# Patient Record
Sex: Female | Born: 1965
Health system: Southern US, Community
[De-identification: ages and names within clinical notes are randomized; demographics above are authoritative.]

## PROBLEM LIST (undated history)

## (undated) ENCOUNTER — Ambulatory Visit: Source: Home / Self Care

## (undated) DIAGNOSIS — R011 Cardiac murmur, unspecified: Secondary | ICD-10-CM

## (undated) DIAGNOSIS — R42 Dizziness and giddiness: Secondary | ICD-10-CM

## (undated) DIAGNOSIS — T7840XA Allergy, unspecified, initial encounter: Secondary | ICD-10-CM

## (undated) DIAGNOSIS — Z780 Asymptomatic menopausal state: Secondary | ICD-10-CM

## (undated) DIAGNOSIS — M1990 Unspecified osteoarthritis, unspecified site: Secondary | ICD-10-CM

## (undated) HISTORY — DX: Cardiac murmur, unspecified: R01.1

## (undated) HISTORY — DX: Asymptomatic menopausal state: Z78.0

## (undated) HISTORY — DX: Allergy, unspecified, initial encounter: T78.40XA

---

## 1998-09-19 ENCOUNTER — Emergency Department (HOSPITAL_COMMUNITY): Admission: EM | Admit: 1998-09-19 | Discharge: 1998-09-19 | Payer: Self-pay | Admitting: Emergency Medicine

## 1999-05-30 HISTORY — PX: ABDOMINAL HYSTERECTOMY: SHX81

## 1999-06-15 ENCOUNTER — Inpatient Hospital Stay (HOSPITAL_COMMUNITY): Admission: RE | Admit: 1999-06-15 | Discharge: 1999-06-17 | Payer: Self-pay | Admitting: Obstetrics and Gynecology

## 2015-04-12 ENCOUNTER — Encounter: Payer: Self-pay | Admitting: Physician Assistant

## 2015-04-12 ENCOUNTER — Ambulatory Visit (INDEPENDENT_AMBULATORY_CARE_PROVIDER_SITE_OTHER): Payer: BLUE CROSS/BLUE SHIELD | Admitting: Physician Assistant

## 2015-04-12 VITALS — BP 127/76 | HR 62 | Temp 98.0°F | Resp 16 | Ht 63.5 in | Wt 207.0 lb

## 2015-04-12 DIAGNOSIS — Z131 Encounter for screening for diabetes mellitus: Secondary | ICD-10-CM

## 2015-04-12 DIAGNOSIS — G4700 Insomnia, unspecified: Secondary | ICD-10-CM

## 2015-04-12 DIAGNOSIS — Z23 Encounter for immunization: Secondary | ICD-10-CM | POA: Diagnosis not present

## 2015-04-12 DIAGNOSIS — Z1321 Encounter for screening for nutritional disorder: Secondary | ICD-10-CM

## 2015-04-12 DIAGNOSIS — Z13220 Encounter for screening for lipoid disorders: Secondary | ICD-10-CM

## 2015-04-12 NOTE — Progress Notes (Signed)
[facility] [address] [place] [address] [phone]  Date:  04/12/2015   Name:  Allison Wright   DOB:  1966/01/25   MRN:  [id number]  PCP:  No primary care provider on file.    Chief Complaint: Establish with practice   History of Present Illness:  This is a 49 y.o. female with PMH env allergies who is presenting to establish care. She last saw a PCP 1.5 years ago.  She sees her GYN regularly. She gets mammogram and paps there. Last mammogram 1 year ago and negative. GYN has also referred her for colonoscopy and but has not heard about it yet. Had partial hysterectomy 2001. Still has both ovaries. She feels she may be starting to enter menopause now. She will get occ hot flashes at night. These do not bother her significantly.  Takes otc sudafed maybe once a week or once every two weeks for allergies.  She has problems with insomnia. She wakes several times during the night. She thinks she wakes because she fears oversleeping. She has an alarm set across the room and one on her phone. She has never tried anything for sleep before. She thinks she gets about 4.5 hours sleep a night and then usually takes a 1-2 hour nap when she gets home from work.  Fam hx - father with DM2. PGM with CHF. No hx CVA, MI, cancers  Review of Systems:  Review of Systems See HPI  There are no active problems to display for this patient.   Prior to Admission medications   Not on File    No Known Allergies  History reviewed. No pertinent past surgical history.  Social History  Substance Use Topics  . Smoking status: Former Smoker    Quit date: 05/29/1996  . Smokeless tobacco: None  . Alcohol Use: No    History reviewed. No pertinent family history.  Medication list has been reviewed and updated.  Physical Examination:  Physical Exam  Constitutional: She is oriented to person, place, and time. She appears well-developed and well-nourished. No distress.   HENT:  Head: Normocephalic and atraumatic.  Right Ear: Hearing, tympanic membrane, external ear and ear canal normal.  Left Ear: Hearing, tympanic membrane, external ear and ear canal normal.  Nose: Nose normal.  Mouth/Throat: Uvula is midline, oropharynx is clear and moist and mucous membranes are normal.  Eyes: Conjunctivae and lids are normal. Right eye exhibits no discharge. Left eye exhibits no discharge. No scleral icterus.  Neck: Trachea normal. Carotid bruit is not present. No thyromegaly present.  Cardiovascular: Normal rate, regular rhythm, normal heart sounds and normal pulses.   No murmur heard. Pulmonary/Chest: Effort normal and breath sounds normal. No respiratory distress. She has no wheezes. She has no rhonchi. She has no rales.  Abdominal: Soft. Normal appearance and bowel sounds are normal. She exhibits no abdominal bruit. There is no tenderness.  Musculoskeletal: Normal range of motion.  Lymphadenopathy:       Head (right side): No submental, no submandibular and no tonsillar adenopathy present.       Head (left side): No submental, no submandibular and no tonsillar adenopathy present.    She has no cervical adenopathy.  Neurological: She is alert and oriented to person, place, and time.  Skin: Skin is warm, dry and intact. No lesion and no rash noted.  Psychiatric: She has a normal mood and affect. Her speech is normal and behavior is normal. Thought content normal.  BP 127/76 mmHg  Pulse 62  Temp(Src) 98 F (36.7 C) (Oral)  Resp 16  Ht 5' 3.5" (1.613 m)  Wt 207 lb (93.895 kg)  BMI 36.09 kg/m2  SpO2 98%  Assessment and Plan:  1. Insomnia Pt has never tried anything for insomnia before. Sounds like most of her waking is d/t worry about oversleeping. She is going to try melatonin to see if this helps. We discussed that there is no way she will oversleep with 2 alarms set. She knows she shouldn't worry. - CBC; Future - TSH; Future  2. Need for Tdap  vaccination - Tdap vaccine greater than or equal to 7yo IM  3. Lipid screening - Lipid panel; Future  4. Diabetes mellitus screening - Comprehensive metabolic panel; Future  5. Encounter for vitamin deficiency screening - Vitamin D 1,25 dihydroxy; Future  GYN is referring her for mammogram and colonoscopy.  Return in 1 year for follow up or sooner if needed.   Roswell MinersNicole V. Dyke BrackettBush, PA-C, MHS [facility]  04/13/2015

## 2015-04-12 NOTE — Patient Instructions (Signed)
Return for fasting blood work. Get your mammogram and colonoscopy this year. Try melatonin for sleep. You may take up to 10 mg before bed. You are doing great with diet. Increase your exercise and work on weight loss but you are doing great!!! Return in 1 year for complete physical or sooner if needed.

## 2015-10-13 DIAGNOSIS — Z1272 Encounter for screening for malignant neoplasm of vagina: Secondary | ICD-10-CM | POA: Diagnosis not present

## 2015-10-13 DIAGNOSIS — Z01419 Encounter for gynecological examination (general) (routine) without abnormal findings: Secondary | ICD-10-CM | POA: Diagnosis not present

## 2015-10-13 DIAGNOSIS — Z6835 Body mass index (BMI) 35.0-35.9, adult: Secondary | ICD-10-CM | POA: Diagnosis not present

## 2015-10-13 DIAGNOSIS — Z1231 Encounter for screening mammogram for malignant neoplasm of breast: Secondary | ICD-10-CM | POA: Diagnosis not present

## 2015-10-13 DIAGNOSIS — Z124 Encounter for screening for malignant neoplasm of cervix: Secondary | ICD-10-CM | POA: Diagnosis not present

## 2015-10-19 ENCOUNTER — Other Ambulatory Visit: Payer: Self-pay | Admitting: Obstetrics and Gynecology

## 2015-10-19 DIAGNOSIS — R928 Other abnormal and inconclusive findings on diagnostic imaging of breast: Secondary | ICD-10-CM

## 2015-10-26 ENCOUNTER — Ambulatory Visit
Admission: RE | Admit: 2015-10-26 | Discharge: 2015-10-26 | Disposition: A | Discharge status: Home / Self Care | Payer: Federal, State, Local not specified - PPO | Source: Ambulatory Visit | Attending: Obstetrics and Gynecology | Admitting: Obstetrics and Gynecology

## 2015-10-26 DIAGNOSIS — R928 Other abnormal and inconclusive findings on diagnostic imaging of breast: Secondary | ICD-10-CM

## 2015-10-26 DIAGNOSIS — N63 Unspecified lump in breast: Secondary | ICD-10-CM | POA: Diagnosis not present

## 2015-10-26 DIAGNOSIS — N6012 Diffuse cystic mastopathy of left breast: Secondary | ICD-10-CM | POA: Diagnosis not present

## 2015-11-22 DIAGNOSIS — K5900 Constipation, unspecified: Secondary | ICD-10-CM | POA: Diagnosis not present

## 2015-12-20 DIAGNOSIS — Z1211 Encounter for screening for malignant neoplasm of colon: Secondary | ICD-10-CM | POA: Diagnosis not present

## 2015-12-20 DIAGNOSIS — D122 Benign neoplasm of ascending colon: Secondary | ICD-10-CM | POA: Diagnosis not present

## 2015-12-20 DIAGNOSIS — D126 Benign neoplasm of colon, unspecified: Secondary | ICD-10-CM | POA: Diagnosis not present

## 2015-12-20 DIAGNOSIS — D125 Benign neoplasm of sigmoid colon: Secondary | ICD-10-CM | POA: Diagnosis not present

## 2015-12-20 DIAGNOSIS — K644 Residual hemorrhoidal skin tags: Secondary | ICD-10-CM | POA: Diagnosis not present

## 2015-12-20 DIAGNOSIS — K648 Other hemorrhoids: Secondary | ICD-10-CM | POA: Diagnosis not present

## 2016-03-21 ENCOUNTER — Other Ambulatory Visit: Payer: Self-pay | Admitting: Obstetrics and Gynecology

## 2016-03-21 DIAGNOSIS — N6320 Unspecified lump in the left breast, unspecified quadrant: Secondary | ICD-10-CM

## 2016-04-28 ENCOUNTER — Ambulatory Visit
Admission: RE | Admit: 2016-04-28 | Discharge: 2016-04-28 | Disposition: A | Discharge status: Home / Self Care | Payer: Federal, State, Local not specified - PPO | Source: Ambulatory Visit | Attending: Obstetrics and Gynecology | Admitting: Obstetrics and Gynecology

## 2016-04-28 ENCOUNTER — Other Ambulatory Visit: Payer: Self-pay | Admitting: Obstetrics and Gynecology

## 2016-04-28 DIAGNOSIS — N6320 Unspecified lump in the left breast, unspecified quadrant: Secondary | ICD-10-CM

## 2016-10-05 ENCOUNTER — Ambulatory Visit (INDEPENDENT_AMBULATORY_CARE_PROVIDER_SITE_OTHER): Payer: Federal, State, Local not specified - PPO

## 2016-10-05 ENCOUNTER — Encounter: Payer: Self-pay | Admitting: Family Medicine

## 2016-10-05 ENCOUNTER — Ambulatory Visit (INDEPENDENT_AMBULATORY_CARE_PROVIDER_SITE_OTHER): Payer: Federal, State, Local not specified - PPO | Admitting: Family Medicine

## 2016-10-05 VITALS — BP 125/80 | HR 56 | Temp 98.3°F | Resp 16 | Ht 63.0 in | Wt 216.3 lb

## 2016-10-05 DIAGNOSIS — M1711 Unilateral primary osteoarthritis, right knee: Secondary | ICD-10-CM

## 2016-10-05 DIAGNOSIS — M25561 Pain in right knee: Secondary | ICD-10-CM | POA: Diagnosis not present

## 2016-10-05 MED ORDER — IBUPROFEN 800 MG PO TABS: 800.0000 mg | ORAL_TABLET | Freq: Three times a day (TID) | ORAL | 1 refills | Status: DC | PRN

## 2016-10-05 NOTE — Patient Instructions (Addendum)
Your xray did show wear and tear arthritis of your knee.  The best initial treatment for this is pain and anti-inflammation medicine such as Ibuprofen.  I have sent this in for you.   I am also referring you to physical therapy.  Give this a shot for the next 2 - 4 weeks.  If you're still having trouble after that, we can talk about a referral to sports medicine or orthopedics for other recommendations.   It was good to meet you today  We recommend that you schedule a mammogram for breast cancer screening. Typically, you do not need a referral to do this. Please contact a local imaging center to schedule your mammogram.  [facility] - [phone]) [phone]  *ask for the Radiology Department [facility] - [phone]) [phone] or [phone]  [facility] - [phone] [facility] - [phone]) [phone] [facility] - [phone]  *ask for the Radiology Department [facility] - [phone]) [phone]  *ask for the Radiology Department [facility] - [phone]  *ask for the Mammography Department [facility] - [phone](336) [phone]    IF you received an x-ray today, you will receive an invoice from [facility]. Please contact [facility] at [phone] with questions or concerns regarding your invoice.   IF you received labwork today, you will receive an invoice from [facility]. Please contact [facility] at [phone] with questions or concerns regarding your invoice.   Our billing staff will not be able to assist you with questions regarding bills from these companies.  You will be contacted with the lab results as soon as they are available. The fastest way to get your results is to activate your My Chart account. Instructions are located on the last page of this paperwork. If you have not heard from us regarding the results in 2 weeks, please contact this office.

## 2016-10-05 NOTE — Progress Notes (Signed)
   Allison Wright is a 51 y.o. female who [facility] today for Right knee pain:  1.  Right knee pain:  Patient with Right knee pain for past 2-3 days.  Describes pain medial aspect of Right knee.  Worse when moving from sitting to standing.  Feels that it "gets stiff" when she sits for period of time.  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    No falls/injuries.  History of similar knee pain several years ago.  States she went through work-up but never received diagnosis and then she switched doctors.  Has had intermittent episodes of mild knee swelling since then but no actual pain.    She's tried Ben-gay and OTC ibuprofen with some relief.    ROS as above.    Fam Hx:  Father with gout and osteoarthritis.  No other joint issues that run in family.  Patient denies any other relevant family history.    PMH reviewed. Patient is a nonsmoker.   Past Medical History:  Diagnosis Date  . Allergy   . Heart murmur    Past Surgical History:  Procedure Laterality Date  . ABDOMINAL HYSTERECTOMY  2001    Medications reviewed. No current outpatient prescriptions on file.   No current facility-administered medications for this visit.      Physical Exam:  BP 125/80 (BP Location: Right Arm, Patient Position: Sitting, Cuff Size: Large)   Pulse (!) 56   Temp 98.3 F (36.8 C) (Oral)   Resp 16   Ht 5\' 3"  (1.6 m)   Wt 216 lb 4.8 oz (98.1 kg)   SpO2 100%   BMI 38.32 kg/m  Gen:  Alert, cooperative patient who appears stated age in no acute distress.  Vital signs reviewed. HEENT: EOMI,  MMM MSK:  Left knee WNL - Right knee:  Some minimal effusion noted over the patella..  No other effusion noted.  No redness, no warmth to joint.  Has tenderness to palpation along medial and lateral joint lines as well as medial aspect of patella. Ant/post drawer test negative.  Lachmann's negative, McMurray's  negative.  Patella-femoral testing also negative.    Assessment and Plan:  1.  Right knee pain: - Osteoarthritis found on radiographs.  Tricompartmental arthritis.   - discussed treatment options with patient.   - Plan will be for now to do conservative therapy with physical therapy and Ibuprofen as antiinflammatory.   - Declined joint injection - she would like to try this for next 2-4 weeks and the FU at that point if she's still having pain to discuss further options.

## 2016-10-09 ENCOUNTER — Encounter: Payer: Self-pay | Admitting: Physician Assistant

## 2016-10-09 ENCOUNTER — Ambulatory Visit (INDEPENDENT_AMBULATORY_CARE_PROVIDER_SITE_OTHER): Payer: Federal, State, Local not specified - PPO | Admitting: Physician Assistant

## 2016-10-09 VITALS — BP 119/71 | HR 54 | Temp 98.0°F | Resp 18 | Ht 63.19 in | Wt 213.6 lb

## 2016-10-09 DIAGNOSIS — Z1159 Encounter for screening for other viral diseases: Secondary | ICD-10-CM

## 2016-10-09 DIAGNOSIS — Z0000 Encounter for general adult medical examination without abnormal findings: Secondary | ICD-10-CM

## 2016-10-09 DIAGNOSIS — R011 Cardiac murmur, unspecified: Secondary | ICD-10-CM | POA: Insufficient documentation

## 2016-10-09 DIAGNOSIS — Z114 Encounter for screening for human immunodeficiency virus [HIV]: Secondary | ICD-10-CM

## 2016-10-09 DIAGNOSIS — Z1329 Encounter for screening for other suspected endocrine disorder: Secondary | ICD-10-CM

## 2016-10-09 DIAGNOSIS — Z13220 Encounter for screening for lipoid disorders: Secondary | ICD-10-CM

## 2016-10-09 DIAGNOSIS — Z131 Encounter for screening for diabetes mellitus: Secondary | ICD-10-CM

## 2016-10-09 DIAGNOSIS — Z1389 Encounter for screening for other disorder: Secondary | ICD-10-CM | POA: Diagnosis not present

## 2016-10-09 DIAGNOSIS — Z130 Encounter for screening for diseases of the blood and blood-forming organs and certain disorders involving the immune mechanism: Secondary | ICD-10-CM

## 2016-10-09 NOTE — Progress Notes (Signed)
10/09/2016 8:53 AM   DOB: [birth year] / MRN: [id number]  SUBJECTIVE:  Allison Wright is a 51 y.o. female presenting for annual exam.  Receives GYN care from specialist and plans to continue that.  PAP and mammograms are current per patient. She had a colonoscopy in 2017 with a five year follow up rec due to 3 polyps, however no high grade dysplasia.    She is a former smoker. See history. Denies EtOH and illicits.    She exercises via walking and exercise classes. Does this 4-5 days per week. She sweats and breath vigorously during these sessions.   She does get her flu shot via her work and plans to continue.   Immunization History  Administered Date(s) Administered  . Influenza-Unspecified 03/02/2015  . Tdap 04/12/2015     She has No Known Allergies.   She  has a past medical history of Allergy; Heart murmur; and Post-menopause.    She  reports that she quit smoking about 20 years ago. She has a 5.00 pack-year smoking history. She has never used smokeless tobacco. She reports that she does not drink alcohol or use drugs. She  reports that she does not engage in sexual activity. The patient  has a past surgical history that includes Abdominal hysterectomy (2001).  Her family history includes Alcohol abuse in her mother; Cirrhosis in her mother; Diabetes (age of onset: 2) in her father; Heart attack in her father; Heart disease in her paternal grandmother.  Review of Systems  Constitutional: Negative for chills and fever.  HENT: Negative for sore throat.   Respiratory: Negative for cough, shortness of breath and wheezing.   Cardiovascular: Negative for chest pain and leg swelling.  Gastrointestinal: Negative for nausea.  Genitourinary: Negative for dysuria.  Musculoskeletal: Negative for myalgias.  Skin: Negative for itching and rash.  Neurological: Positive for headaches (frontal sinus, constant, 15-20, no weakness, numbess, vison changes).  Psychiatric/Behavioral: Negative for  depression.    The problem list and medications were reviewed and updated by myself where necessary and exist elsewhere in the encounter.   OBJECTIVE:  BP 119/71   Pulse (!) 54   Temp 98 F (36.7 C) (Oral)   Resp 18   Ht 5' 3.19" (1.605 m)   Wt 213 lb 9.6 oz (96.9 kg)   SpO2 97%   BMI 37.61 kg/m   Pulse Readings from Last 3 Encounters:  10/09/16 (!) 54  10/05/16 (!) 56  04/12/15 62   BP Readings from Last 3 Encounters:  10/09/16 119/71  10/05/16 125/80  04/12/15 127/76      Physical Exam    No results found for this or any previous visit (from the past 72 hour(s)).  No results found.  ASSESSMENT AND PLAN:  Allison Wright was seen today for annual exam.  Diagnoses and all orders for this visit:  Annual physical exam: She is exericising on most days of the week.  No substance abuse.  Health female exam.  Labs out.  RTC as needed and will see her back in on year.  -     Care order/instruction:  Screening for deficiency anemia -     CBC  Screening for nephropathy -     CMP14+EGFR  Screening for lipid disorders -     Lipid panel  Screening for thyroid disorder -     TSH  Screening for diabetes mellitus -     Hemoglobin A1c  Screening for HIV (human immunodeficiency virus) -  HIV antibody  Need for hepatitis C screening test -     Hepatitis C antibody    The patient is advised to call or return to clinic if she does not see an improvement in symptoms, or to seek the care of the closest emergency department if she worsens with the above plan.   Philis Fendt, MHS, PA-C [facility] 10/09/2016 8:53 AM

## 2016-10-09 NOTE — Patient Instructions (Addendum)
Consider Claritin (loratidine) for daily allergic.     IF you received an x-ray today, you will receive an invoice from [facility]. Please contact [facility] at [phone] with questions or concerns regarding your invoice.   IF you received labwork today, you will receive an invoice from [facility]. Please contact [facility] at [phone] with questions or concerns regarding your invoice.   Our billing staff will not be able to assist you with questions regarding bills from these companies.  You will be contacted with the lab results as soon as they are available. The fastest way to get your results is to activate your My Chart account. Instructions are located on the last page of this paperwork. If you have not heard from us regarding the results in 2 weeks, please contact this office.

## 2016-10-10 LAB — CBC
HEMOGLOBIN: 11.9 g/dL (ref 11.1–15.9)
Hematocrit: 37.1 % (ref 34.0–46.6)
MCH: 28.6 pg (ref 26.6–33.0)
MCHC: 32.1 g/dL (ref 31.5–35.7)
MCV: 89 fL (ref 79–97)
PLATELETS: 330 10*3/uL (ref 150–379)
RBC: 4.16 x10E6/uL (ref 3.77–5.28)
RDW: 15.4 % (ref 12.3–15.4)
WBC: 7.5 10*3/uL (ref 3.4–10.8)

## 2016-10-10 LAB — HEMOGLOBIN A1C
Est. average glucose Bld gHb Est-mCnc: 123 mg/dL
Hgb A1c MFr Bld: 5.9 % — ABNORMAL HIGH (ref 4.8–5.6)

## 2016-10-10 LAB — CMP14+EGFR
ALBUMIN: 4.3 g/dL (ref 3.5–5.5)
ALK PHOS: 81 IU/L (ref 39–117)
ALT: 17 IU/L (ref 0–32)
AST: 17 IU/L (ref 0–40)
Albumin/Globulin Ratio: 1.5 (ref 1.2–2.2)
BUN / CREAT RATIO: 12 (ref 9–23)
BUN: 9 mg/dL (ref 6–24)
CO2: 24 mmol/L (ref 18–29)
CREATININE: 0.74 mg/dL (ref 0.57–1.00)
Calcium: 9.2 mg/dL (ref 8.7–10.2)
Chloride: 103 mmol/L (ref 96–106)
GFR calc Af Amer: 108 mL/min/{1.73_m2} (ref >59)
GFR calc non Af Amer: 93 mL/min/{1.73_m2} (ref >59)
GLUCOSE: 99 mg/dL (ref 65–99)
Globulin, Total: 2.9 g/dL (ref 1.5–4.5)
Potassium: 4.7 mmol/L (ref 3.5–5.2)
Sodium: 144 mmol/L (ref 134–144)
Total Protein: 7.2 g/dL (ref 6.0–8.5)

## 2016-10-10 LAB — HIV ANTIBODY (ROUTINE TESTING W REFLEX): HIV Screen 4th Generation wRfx: NONREACTIVE

## 2016-10-10 LAB — HEPATITIS C ANTIBODY

## 2016-10-10 LAB — TSH: TSH: 2.51 u[IU]/mL (ref 0.450–4.500)

## 2016-10-10 LAB — LIPID PANEL
CHOLESTEROL TOTAL: 247 mg/dL — AB (ref 100–199)
Chol/HDL Ratio: 3.3 ratio (ref 0.0–4.4)
HDL: 75 mg/dL (ref >39)
LDL Calculated: 155 mg/dL — ABNORMAL HIGH (ref 0–99)
TRIGLYCERIDES: 87 mg/dL (ref 0–149)
VLDL CHOLESTEROL CAL: 17 mg/dL (ref 5–40)

## 2016-10-12 ENCOUNTER — Encounter: Payer: Self-pay | Admitting: Physician Assistant

## 2016-10-12 ENCOUNTER — Encounter: Payer: Self-pay | Admitting: Radiology

## 2016-10-12 DIAGNOSIS — E663 Overweight: Secondary | ICD-10-CM | POA: Insufficient documentation

## 2016-10-12 DIAGNOSIS — R7303 Prediabetes: Secondary | ICD-10-CM | POA: Insufficient documentation

## 2016-10-12 NOTE — Progress Notes (Signed)
Patient with prediabetes.  Please send a letter.  Advise she visit [email or web address].org to learn more about prediabetes.  I want to see her back in 6 month for an a1c check. Scheduling please make her an appointment. Deliah BostonMichael Clark, MS, PA-C 12:19 PM, 10/12/2016

## 2016-10-30 ENCOUNTER — Other Ambulatory Visit: Payer: Self-pay | Admitting: Obstetrics and Gynecology

## 2016-10-31 ENCOUNTER — Other Ambulatory Visit: Payer: Self-pay | Admitting: Obstetrics and Gynecology

## 2016-10-31 DIAGNOSIS — R922 Inconclusive mammogram: Secondary | ICD-10-CM

## 2016-11-30 ENCOUNTER — Other Ambulatory Visit: Payer: Self-pay | Admitting: Obstetrics and Gynecology

## 2016-11-30 DIAGNOSIS — N63 Unspecified lump in unspecified breast: Secondary | ICD-10-CM

## 2016-12-04 DIAGNOSIS — Z01419 Encounter for gynecological examination (general) (routine) without abnormal findings: Secondary | ICD-10-CM | POA: Diagnosis not present

## 2016-12-04 DIAGNOSIS — Z6836 Body mass index (BMI) 36.0-36.9, adult: Secondary | ICD-10-CM | POA: Diagnosis not present

## 2016-12-06 ENCOUNTER — Ambulatory Visit
Admission: RE | Admit: 2016-12-06 | Discharge: 2016-12-06 | Disposition: A | Discharge status: Home / Self Care | Payer: Federal, State, Local not specified - PPO | Source: Ambulatory Visit | Attending: Obstetrics and Gynecology | Admitting: Obstetrics and Gynecology

## 2016-12-06 ENCOUNTER — Ambulatory Visit: Admission: RE | Admit: 2016-12-06 | Payer: Federal, State, Local not specified - PPO | Source: Ambulatory Visit

## 2016-12-06 ENCOUNTER — Encounter (INDEPENDENT_AMBULATORY_CARE_PROVIDER_SITE_OTHER): Payer: Self-pay

## 2016-12-06 ENCOUNTER — Other Ambulatory Visit: Payer: Self-pay | Admitting: Obstetrics and Gynecology

## 2016-12-06 DIAGNOSIS — R928 Other abnormal and inconclusive findings on diagnostic imaging of breast: Secondary | ICD-10-CM | POA: Diagnosis not present

## 2016-12-06 DIAGNOSIS — R921 Mammographic calcification found on diagnostic imaging of breast: Secondary | ICD-10-CM

## 2016-12-06 DIAGNOSIS — N63 Unspecified lump in unspecified breast: Secondary | ICD-10-CM

## 2016-12-15 ENCOUNTER — Ambulatory Visit
Admission: RE | Admit: 2016-12-15 | Discharge: 2016-12-15 | Disposition: A | Discharge status: Home / Self Care | Payer: Federal, State, Local not specified - PPO | Source: Ambulatory Visit | Attending: Obstetrics and Gynecology | Admitting: Obstetrics and Gynecology

## 2016-12-15 DIAGNOSIS — D241 Benign neoplasm of right breast: Secondary | ICD-10-CM | POA: Diagnosis not present

## 2016-12-15 DIAGNOSIS — R920 Mammographic microcalcification found on diagnostic imaging of breast: Secondary | ICD-10-CM | POA: Diagnosis not present

## 2016-12-15 DIAGNOSIS — R921 Mammographic calcification found on diagnostic imaging of breast: Secondary | ICD-10-CM

## 2017-03-19 ENCOUNTER — Encounter: Payer: Self-pay | Admitting: Physician Assistant

## 2017-03-19 ENCOUNTER — Ambulatory Visit (INDEPENDENT_AMBULATORY_CARE_PROVIDER_SITE_OTHER): Payer: Federal, State, Local not specified - PPO | Admitting: Physician Assistant

## 2017-03-19 VITALS — BP 110/69 | HR 60 | Temp 98.4°F | Resp 16 | Ht 63.19 in | Wt 201.0 lb

## 2017-03-19 DIAGNOSIS — E7800 Pure hypercholesterolemia, unspecified: Secondary | ICD-10-CM

## 2017-03-19 DIAGNOSIS — R7303 Prediabetes: Secondary | ICD-10-CM | POA: Diagnosis not present

## 2017-03-19 NOTE — Progress Notes (Signed)
03/19/2017 1:59 PM   DOB: 1965-10-26 / MRN: [id number]  SUBJECTIVE:  Allison Wright is a 51 y.o. female presenting for prediabetes recheck. She has lost weight.  Not eating as much sugar and red meat.  Bought an air fryer and tells me this yields a much healthier recipe.  She does try to go to the gym about 3-4 days per week and likes the TM and the some classes. She recently had a flu shot.   Immunization History  Administered Date(s) Administered  . Influenza-Unspecified 03/02/2015  . Tdap 04/12/2015     She has No Known Allergies.   She  has a past medical history of Allergy; Heart murmur; and Post-menopause.    She  reports that she quit smoking about 20 years ago. She has a 5.00 pack-year smoking history. She has never used smokeless tobacco. She reports that she does not drink alcohol or use drugs. She  reports that she does not engage in sexual activity. The patient  has a past surgical history that includes Abdominal hysterectomy (2001).  Her family history includes Alcohol abuse in her mother; Cirrhosis in her mother; Diabetes (age of onset: 61) in her father; Heart attack in her father; Heart disease in her paternal grandmother.  Review of Systems  Constitutional: Negative for chills, diaphoresis and fever.  Eyes: Negative.   Respiratory: Negative for cough, hemoptysis, sputum production, shortness of breath and wheezing.   Cardiovascular: Negative for chest pain, orthopnea and leg swelling.  Gastrointestinal: Negative for abdominal pain, blood in stool, constipation, diarrhea, heartburn, melena, nausea and vomiting.  Genitourinary: Negative for flank pain.  Skin: Negative for rash.  Neurological: Negative for dizziness, sensory change, speech change, focal weakness and headaches.    The problem list and medications were reviewed and updated by myself where necessary and exist elsewhere in the encounter.   OBJECTIVE:  BP 110/69 (BP Location: Right Arm, Patient  Position: Sitting, Cuff Size: Large)   Pulse 60   Temp 98.4 F (36.9 C) (Oral)   Resp 16   Ht 5' 3.19" (1.605 m)   Wt 201 lb (91.2 kg)   SpO2 99%   BMI 35.39 kg/m   Physical Exam  Constitutional: She is oriented to person, place, and time. She is active.  Non-toxic appearance.  HENT:  Head: Normocephalic.  Right Ear: External ear normal.  Left Ear: External ear normal.  Nose: Nose normal.  Mouth/Throat: No oropharyngeal exudate.  Eyes: Pupils are equal, round, and reactive to light.  Cardiovascular: Normal rate, regular rhythm, S1 normal, S2 normal, normal heart sounds and intact distal pulses.  Exam reveals no gallop, no friction rub and no decreased pulses.   No murmur heard. Pulmonary/Chest: Effort normal. No stridor. No tachypnea. No respiratory distress. She has no wheezes. She has no rales.  Abdominal: She exhibits no distension.  Musculoskeletal: She exhibits no edema.  Neurological: She is alert and oriented to person, place, and time. No cranial nerve deficit.  Skin: Skin is warm and dry. She is not diaphoretic. No pallor.     Lab Results  Component Value Date   WBC 7.5 10/09/2016   HGB 11.9 10/09/2016   HCT 37.1 10/09/2016   MCV 89 10/09/2016   PLT 330 10/09/2016    Lab Results  Component Value Date   CREATININE 0.74 10/09/2016   BUN 9 10/09/2016   NA 144 10/09/2016   K 4.7 10/09/2016   CL 103 10/09/2016   CO2 24 10/09/2016  Lab Results  Component Value Date   ALT 17 10/09/2016   AST 17 10/09/2016   ALKPHOS 81 10/09/2016   BILITOT <0.2 10/09/2016    Lab Results  Component Value Date   TSH 2.510 10/09/2016    Lab Results  Component Value Date   HGBA1C 5.9 (H) 10/09/2016    Lab Results  Component Value Date   CHOL 247 (H) 10/09/2016   HDL 75 10/09/2016   LDLCALC 155 (H) 10/09/2016   TRIG 87 10/09/2016   CHOLHDL 3.3 10/09/2016   Wt Readings from Last 3 Encounters:  03/19/17 201 lb (91.2 kg)  10/09/16 213 lb 9.6 oz (96.9 kg)    10/05/16 216 lb 4.8 oz (98.1 kg)   Temp Readings from Last 3 Encounters:  03/19/17 98.4 F (36.9 C) (Oral)  10/09/16 98 F (36.7 C) (Oral)  10/05/16 98.3 F (36.8 C) (Oral)   BP Readings from Last 3 Encounters:  03/19/17 110/69  10/09/16 119/71  10/05/16 125/80   Pulse Readings from Last 3 Encounters:  03/19/17 60  10/09/16 (!) 54  10/05/16 (!) 56   Wt Readings from Last 3 Encounters:  03/19/17 201 lb (91.2 kg)  10/09/16 213 lb 9.6 oz (96.9 kg)  10/05/16 216 lb 4.8 oz (98.1 kg)   The 10-year ASCVD risk score Denman George(Goff DC Jr., et al., 2013) is: 1.1%   Values used to calculate the score:     Age: [age] years     Sex: Female     Is Non-Hispanic African American: Yes     Diabetic: No     Tobacco smoker: No     Systolic Blood Pressure: 110 mmHg     Is BP treated: No     HDL Cholesterol: 75 mg/dL     Total Cholesterol: 247 mg/dL     No results found for this or any previous visit (from the past 72 hour(s)).  No results found.  ASSESSMENT AND PLAN:  Allison Wright was seen today for follow-up.  Diagnoses and all orders for this visit:  Prediabetes: She has lost about 10-15 lbs.  Has changed her diet and is trying to exercise more.  -     POCT glycosylated hemoglobin (Hb A1C)  Elevated LDL cholesterol level -     Lipid panel  Other orders -     Cancel: Hemoglobin A1c    The patient is advised to call or return to clinic if she does not see an improvement in symptoms, or to seek the care of the closest emergency department if she worsens with the above plan.   Deliah BostonMichael Clark, MHS, PA-C [facility] 03/19/2017 1:59 PM

## 2017-03-19 NOTE — Patient Instructions (Addendum)
You prediabetes is better.  Your blood pressure is better. We are having problems with the A1c machine right now and I will get you number back to you asap.  KEEP UP THE GOOD WORK!!    IF you received an x-ray today, you will receive an invoice from [facility]. Please contact [facility] at [phone] with questions or concerns regarding your invoice.   IF you received labwork today, you will receive an invoice from [facility]. Please contact [facility] at [phone] with questions or concerns regarding your invoice.   Our billing staff will not be able to assist you with questions regarding bills from these companies.  You will be contacted with the lab results as soon as they are available. The fastest way to get your results is to activate your My Chart account. Instructions are located on the last page of this paperwork. If you have not heard from us regarding the results in 2 weeks, please contact this office.

## 2017-03-20 LAB — LIPID PANEL
CHOL/HDL RATIO: 3.7 ratio (ref 0.0–4.4)
CHOLESTEROL TOTAL: 228 mg/dL — AB (ref 100–199)
HDL: 61 mg/dL (ref >39)
LDL CALC: 146 mg/dL — AB (ref 0–99)
TRIGLYCERIDES: 105 mg/dL (ref 0–149)
VLDL CHOLESTEROL CAL: 21 mg/dL (ref 5–40)

## 2017-03-20 LAB — HEMOGLOBIN A1C
Est. average glucose Bld gHb Est-mCnc: 120 mg/dL
Hgb A1c MFr Bld: 5.8 % — ABNORMAL HIGH (ref 4.8–5.6)

## 2017-03-21 NOTE — Progress Notes (Signed)
A1c is improving.  Risk of heart disease and stroke very low at 1.1%.  Please encourage her to keep up the excellent work. Deliah BostonMichael Clark, MS, PA-C 4:33 PM, 03/21/2017

## 2017-04-09 ENCOUNTER — Ambulatory Visit: Payer: Federal, State, Local not specified - PPO | Admitting: Physician Assistant

## 2017-08-13 ENCOUNTER — Encounter: Payer: Self-pay | Admitting: Podiatry

## 2017-08-13 ENCOUNTER — Ambulatory Visit: Payer: Federal, State, Local not specified - PPO | Admitting: Podiatry

## 2017-08-13 ENCOUNTER — Ambulatory Visit (INDEPENDENT_AMBULATORY_CARE_PROVIDER_SITE_OTHER): Payer: Federal, State, Local not specified - PPO

## 2017-08-13 DIAGNOSIS — M7732 Calcaneal spur, left foot: Secondary | ICD-10-CM | POA: Diagnosis not present

## 2017-08-13 DIAGNOSIS — M722 Plantar fascial fibromatosis: Secondary | ICD-10-CM | POA: Diagnosis not present

## 2017-08-13 DIAGNOSIS — M84375A Stress fracture, left foot, initial encounter for fracture: Secondary | ICD-10-CM | POA: Diagnosis not present

## 2017-08-13 DIAGNOSIS — M779 Enthesopathy, unspecified: Secondary | ICD-10-CM | POA: Diagnosis not present

## 2017-08-13 MED ORDER — MELOXICAM 7.5 MG PO TABS: 7.5000 mg | ORAL_TABLET | Freq: Every day | ORAL | 0 refills | Status: DC

## 2017-08-13 NOTE — Progress Notes (Signed)
Subjective:    Patient ID: Allison Wright, female    DOB: September 16, 1965, 52 y.o.   MRN: [id number]  HPI 52 year old female presents the office today for concerns of left heel pain which is been ongoing for the last 2 weeks has been consistent.  She states that it feels like it on fire at times.  She denies any recent injury or trauma or any change or increase in activity level.  She has not there is any significant swelling she denies any numbness or tingling the pain does not wake her up at night.  She has pain with weightbearing.  She has no other concerns today.   Review of Systems  All other systems reviewed and are negative.  Past Medical History:  Diagnosis Date  . Allergy   . Heart murmur   . Post-menopause     Past Surgical History:  Procedure Laterality Date  . ABDOMINAL HYSTERECTOMY  2001   Retained cervix     Current Outpatient Medications:  .  ibuprofen (ADVIL,MOTRIN) 800 MG tablet, Take 1 tablet (800 mg total) by mouth every 8 (eight) hours as needed., Disp: 30 tablet, Rfl: 1 .  meloxicam (MOBIC) 7.5 MG tablet, Take 1 tablet (7.5 mg total) by mouth daily., Disp: 30 tablet, Rfl: 0  No Known Allergies  Social History   Socioeconomic History  . Marital status: Single    Spouse name: Not on file  . Number of children: Not on file  . Years of education: Not on file  . Highest education level: Not on file  Occupational History  . Not on file  Social Needs  . Financial resource strain: Not on file  . Food insecurity:    Worry: Not on file    Inability: Not on file  . Transportation needs:    Medical: Not on file    Non-medical: Not on file  Tobacco Use  . Smoking status: Former Smoker    Packs/day: 0.50    Years: 10.00    Pack years: 5.00    Last attempt to quit: 05/29/1996    Years since quitting: 21.2  . Smokeless tobacco: Never Used  Substance and Sexual Activity  . Alcohol use: No    Alcohol/week: 0.0 oz  . Drug use: No  . Sexual activity: Never    Lifestyle  . Physical activity:    Days per week: Not on file    Minutes per session: Not on file  . Stress: Not on file  Relationships  . Social connections:    Talks on phone: Not on file    Gets together: Not on file    Attends religious service: Not on file    Active member of club or organization: Not on file    Attends meetings of clubs or organizations: Not on file    Relationship status: Not on file  . Intimate partner violence:    Fear of current or ex partner: Not on file    Emotionally abused: Not on file    Physically abused: Not on file    Forced sexual activity: Not on file  Other Topics Concern  . Not on file  Social History Narrative  . Not on file         Objective:   Physical Exam General: AAO x3, NAD  Dermatological: Skin is warm, dry and supple bilateral. Nails x 10 are well manicured; remaining integument appears unremarkable at this time. There are no open sores, no preulcerative lesions, no  rash or signs of infection present.  Vascular: Dorsalis Pedis artery and Posterior Tibial artery pedal pulses are 2/4 bilateral with immedate capillary fill time. Pedal hair growth present. No varicosities and no lower extremity edema present bilateral. There is no pain with calf compression, swelling, warmth, erythema.   Neruologic: Grossly intact via light touch bilateral. Protective threshold with Semmes Wienstein monofilament intact to all pedal sites bilateral.   Musculoskeletal: There is tenderness of the posterior as well as anterior aspect the left heel there is mild pain with lateral compression of the calcaneus.  There is no pain to vibratory sensation however.  There is trace edema there is no erythema or increase in warmth.  Achilles tendon appears to be intact, plantar fascia appears to be intact.  No other area tenderness identified bilaterally.  Muscular strength 5/5 in all groups tested bilateral.  Gait: Unassisted, Nonantalgic.     Assessment &  Plan:  52 year old female with left heel pain, likely stress fracture-with tendinitis -Treatment options discussed including all alternatives, risks, and complications -Etiology of symptoms were discussed -X-rays were obtained and reviewed with the patient.  There is a sclerotic line present in the calcaneus concerning for stress fracture. -At this time given the extreme pain I recommend immobilization in a cam boot which was dispensed today. -Ice the area daily -Prescribed mobic. Discussed side effects of the medication and directed to stop if any are to occur and call the office.  -Follow-up in 2-3 weeks for repeat x-rays or sooner if needed.  Call any questions or concerns meantime.  *X-ray next appointment, include calcaneal axial  Vivi BarrackMatthew R Wagoner DPM

## 2017-08-27 ENCOUNTER — Ambulatory Visit (INDEPENDENT_AMBULATORY_CARE_PROVIDER_SITE_OTHER): Payer: Federal, State, Local not specified - PPO

## 2017-08-27 ENCOUNTER — Ambulatory Visit: Payer: Federal, State, Local not specified - PPO | Admitting: Podiatry

## 2017-08-27 DIAGNOSIS — M84375A Stress fracture, left foot, initial encounter for fracture: Secondary | ICD-10-CM

## 2017-08-27 NOTE — Progress Notes (Signed)
Subjective: Allison Wright presents the office today for follow-up evaluation of left heel pain, stress fracture.  She states that she is "coming along".  She is remained in the cam boot.  She still gets discomfort with walking in the boot however it is improved.  No recent injury or falls.  No recent swelling or redness that she has noticed.  She has no new concerns. Denies any systemic complaints such as fevers, chills, nausea, vomiting. No acute changes since last appointment, and no other complaints at this time.   Objective: AAO x3, NAD DP/PT pulses palpable bilaterally, CRT less than 3 seconds There is tenderness palpation lateral compression of the calcaneus as well as tenderness to the posterior aspect the calcaneus.  There is no pain to vibratory sensation.  There is no edema, erythema, increase in warmth.  Also mild tenderness palpation on the plantar medial tubercle of the calcaneus at the insertion of the plantar fascia.  Achilles tendon appears to be intact.  Thompson test is negative. No open lesions or pre-ulcerative lesions.  No pain with calf compression, swelling, warmth, erythema  Assessment: Stress fracture left calcaneus  Plan: -All treatment options discussed with the patient including all alternatives, risks, complications.  -Repeat x-rays were obtained and reviewed with her.  Radiolucent line still evident within the knee is no cellulitic visible on the calcaneal axial knee oblique views.  This appears to be consistent with a stress fracture. -We discussed an [place]. -We will continue the cam boot.  Discussed pain in the boot for a total of 4-6 weeks until pain resolves.  Unfortunately she will be on vacation at that time frame.  I will see her back when she gets back from vacation.  She continues to have pain we will get an MRI at that point.  Continue to ice and elevate.  Limit activity. -Patient encouraged to call the office  with any questions, concerns, change in symptoms.   Vivi BarrackMatthew R Wagoner DPM

## 2017-08-28 DIAGNOSIS — M84375A Stress fracture, left foot, initial encounter for fracture: Secondary | ICD-10-CM | POA: Insufficient documentation

## 2017-09-09 ENCOUNTER — Other Ambulatory Visit: Payer: Self-pay | Admitting: Podiatry

## 2017-10-08 ENCOUNTER — Ambulatory Visit: Payer: Federal, State, Local not specified - PPO | Admitting: Podiatry

## 2017-10-08 ENCOUNTER — Encounter: Payer: Self-pay | Admitting: Podiatry

## 2017-10-08 ENCOUNTER — Ambulatory Visit (INDEPENDENT_AMBULATORY_CARE_PROVIDER_SITE_OTHER): Payer: Federal, State, Local not specified - PPO

## 2017-10-08 DIAGNOSIS — M779 Enthesopathy, unspecified: Secondary | ICD-10-CM

## 2017-10-08 DIAGNOSIS — M84375A Stress fracture, left foot, initial encounter for fracture: Secondary | ICD-10-CM

## 2017-10-08 MED ORDER — MELOXICAM 7.5 MG PO TABS: 7.5000 mg | ORAL_TABLET | Freq: Every day | ORAL | 0 refills | Status: DC

## 2017-10-08 NOTE — Patient Instructions (Signed)

## 2017-10-10 NOTE — Progress Notes (Signed)
Subjective: 52 year old female presents the office today for follow-up evaluation of stress fracture left calcaneus.  She states that she is still in the boot she will occasionally get some swelling.  She states that overall her pain has improved.  She did have one day where she had some pain to her leg but that has improved as well.  Denies any recent injury or falls.  She has no new concerns. Denies any systemic complaints such as fevers, chills, nausea, vomiting. No acute changes since last appointment, and no other complaints at this time.   Objective: AAO x3, NAD DP/PT pulses palpable bilaterally, CRT less than 3 seconds At this time the majority tenderness is in the posterior aspect the calcaneus and insertion of the Achilles tendon into the calcaneus.  Janee Morn test is negative there is no pain on the mid substance of the course of the Achilles tendon although the insertion of the calcaneus.  There is no pain with lateral compression of the calcaneus there is no pain to vibratory sensation.  Minimal edema to the posterior heel but overall there is no swelling or redness of the foot. No open lesions or pre-ulcerative lesions.  No pain with calf compression, swelling, warmth, erythema  Assessment: Resolving stress fracture left heel with insertional Achilles tendinitis.  Plan: -All treatment options discussed with the patient including all alternatives, risks, complications.  -Repeat x-rays were obtained reviewed.  There is still a sclerotic line present however does appear to be somewhat improved. -At this point she is been immobilized in the last couple months has been doing well.  I would initially start to transition to regular shoe as tolerable and start some range of motion, stretching, rehab exercises. Prescribed mobic. Discussed side effects of the medication and directed to stop if any are to occur and call the office.  She transition to regular shoe changes infiltrate dispensed  offloading pad to left heel.  Ice to the area daily.  If she has any issues and has any increase in pain transition to regular shoe she is in the meantime we will order an MRI.  However since she is improving room without an MRI today. -Patient encouraged to call the office with any questions, concerns, change in symptoms.   Vivi Barrack DPM

## 2017-10-29 ENCOUNTER — Ambulatory Visit: Payer: Federal, State, Local not specified - PPO | Admitting: Podiatry

## 2017-10-29 ENCOUNTER — Encounter: Payer: Self-pay | Admitting: Podiatry

## 2017-10-29 DIAGNOSIS — M779 Enthesopathy, unspecified: Secondary | ICD-10-CM

## 2017-10-29 DIAGNOSIS — M84375A Stress fracture, left foot, initial encounter for fracture: Secondary | ICD-10-CM | POA: Diagnosis not present

## 2017-10-31 NOTE — Progress Notes (Signed)
Subjective: 52 year old female presents the office today for follow-up evaluation of stress fracture of the left heel.  She states that she has been doing well she is having no pain.  She is actually able to go barefoot and in a regular shoe around the house without any discomfort.  She denies any swelling or redness in any recent injury.  She has no other concerns today.  She states that she is ready to get out of the boot. Denies any systemic complaints such as fevers, chills, nausea, vomiting. No acute changes since last appointment, and no other complaints at this time.   Objective: AAO x3, NAD DP/PT pulses palpable bilaterally, CRT less than 3 seconds At this time there is no tenderness palpation along the inferior portion of the calcaneus or lateral compression of the calcaneus.  There is only very minimal discomfort on the insertion of the Achilles tendon into the posterior calcaneus and the Achilles tendon appears to be intact.  There is no edema, erythema, increase in warmth.  No other areas of tenderness. No open lesions or pre-ulcerative lesions.  No pain with calf compression, swelling, warmth, erythema  Assessment: Resolving left heel pain, tendinitis, stress fracture   Plan: -All treatment options discussed with the patient including all alternatives, risks, complications.  -At this time she is doing well she is having no pain.  Because of this I held off on x-rays.  She is able to go barefoot and a regular shoe around the house without any pain.  We can transition to wearing a shoe full-time.  She has she left the office with the boot on because she states that it felt well.  Continue stretching exercise as well as ice.  Follow-up she has any issues or any new concerns and she agrees with this plan. -Patient encouraged to call the office with any questions, concerns, change in symptoms.   Vivi BarrackMatthew R Cabe Lashley DPM

## 2017-12-03 ENCOUNTER — Ambulatory Visit: Payer: Federal, State, Local not specified - PPO | Admitting: Podiatry

## 2017-12-06 ENCOUNTER — Other Ambulatory Visit: Payer: Self-pay | Admitting: Podiatry

## 2017-12-06 DIAGNOSIS — Z6834 Body mass index (BMI) 34.0-34.9, adult: Secondary | ICD-10-CM | POA: Diagnosis not present

## 2017-12-06 DIAGNOSIS — Z01419 Encounter for gynecological examination (general) (routine) without abnormal findings: Secondary | ICD-10-CM | POA: Diagnosis not present

## 2017-12-10 ENCOUNTER — Ambulatory Visit (INDEPENDENT_AMBULATORY_CARE_PROVIDER_SITE_OTHER): Payer: Federal, State, Local not specified - PPO | Admitting: Physician Assistant

## 2017-12-10 ENCOUNTER — Other Ambulatory Visit: Payer: Self-pay

## 2017-12-10 ENCOUNTER — Other Ambulatory Visit: Payer: Self-pay | Admitting: Podiatry

## 2017-12-10 ENCOUNTER — Encounter: Payer: Federal, State, Local not specified - PPO | Admitting: Physician Assistant

## 2017-12-10 ENCOUNTER — Encounter: Payer: Self-pay | Admitting: Physician Assistant

## 2017-12-10 VITALS — BP 120/69 | HR 65 | Temp 98.4°F | Resp 18 | Ht 63.0 in | Wt 199.8 lb

## 2017-12-10 DIAGNOSIS — Z1321 Encounter for screening for nutritional disorder: Secondary | ICD-10-CM

## 2017-12-10 DIAGNOSIS — M25561 Pain in right knee: Secondary | ICD-10-CM

## 2017-12-10 DIAGNOSIS — Z0000 Encounter for general adult medical examination without abnormal findings: Secondary | ICD-10-CM | POA: Diagnosis not present

## 2017-12-10 DIAGNOSIS — B079 Viral wart, unspecified: Secondary | ICD-10-CM

## 2017-12-10 DIAGNOSIS — Z1389 Encounter for screening for other disorder: Secondary | ICD-10-CM | POA: Diagnosis not present

## 2017-12-10 DIAGNOSIS — Z23 Encounter for immunization: Secondary | ICD-10-CM

## 2017-12-10 DIAGNOSIS — Z13220 Encounter for screening for lipoid disorders: Secondary | ICD-10-CM

## 2017-12-10 DIAGNOSIS — G8929 Other chronic pain: Secondary | ICD-10-CM

## 2017-12-10 MED ORDER — ZOSTER VAC RECOMB ADJUVANTED 50 MCG/0.5ML IM SUSR: 0.5000 mL | Freq: Once | INTRAMUSCULAR | 1 refills | Status: AC

## 2017-12-10 NOTE — Patient Instructions (Addendum)
For the knee pain.  Keep up the exercise.  When it hurts take tylenol 1000 mg every 8 hours and add in 400 mg of ibuprofen  Every 8 hour if there is any swelling.  Take Zantac 75 mg every night until the wart clears up. Come back in about 3 weeks if the wart seems like it is not going away.   Go get your shingle shot.  I am checking your labs.       IF you received an x-ray today, you will receive an invoice from [facility]. Please contact [facility] at [phone] with questions or concerns regarding your invoice.   IF you received labwork today, you will receive an invoice from [facility]. Please contact [facility] at [phone] with questions or concerns regarding your invoice.   Our billing staff will not be able to assist you with questions regarding bills from these companies.  You will be contacted with the lab results as soon as they are available. The fastest way to get your results is to activate your My Chart account. Instructions are located on the last page of this paperwork. If you have not heard from us regarding the results in 2 weeks, please contact this office.

## 2017-12-10 NOTE — Progress Notes (Signed)
12/10/2017 3:00 PM   DOB: Apr 30, 1966 / MRN: [id number]  SUBJECTIVE:  Allison Wright is a 52 y.o. female presenting for annual exam. History of prediabetes improved with weight loss and continues to lose. No family history of low bone density. Due back for colonoscopy in 2022.   Non drinker. Non smoker quit in 1998.   Just saw her GYN last Thursday and was advised that it was not time to have a pap given previous normal exams.   Complains of right knee pain that swells up from time to time.   Immunization History  Administered Date(s) Administered  . Influenza-Unspecified 03/02/2015  . Tdap 04/12/2015    She has No Known Allergies.   She  has a past medical history of Allergy, Heart murmur, and Post-menopause.    She  reports that she quit smoking about 21 years ago. She has a 5.00 pack-year smoking history. She has never used smokeless tobacco. She reports that she does not drink alcohol or use drugs. She  reports that she does not engage in sexual activity. The patient  has a past surgical history that includes Abdominal hysterectomy (2001).  Her family history includes Alcohol abuse in her mother; Cirrhosis in her mother; Diabetes (age of onset: 2855) in her father; Heart attack in her father; Heart disease in her paternal grandmother.  Review of Systems  Constitutional: Negative for chills, diaphoresis and fever.  Eyes: Negative.   Respiratory: Negative for cough, hemoptysis, sputum production, shortness of breath and wheezing.   Cardiovascular: Negative for chest pain, orthopnea and leg swelling.  Gastrointestinal: Negative for abdominal pain, blood in stool, constipation, diarrhea, heartburn, melena, nausea and vomiting.  Genitourinary: Negative for flank pain.  Skin: Negative for rash.  Neurological: Negative for dizziness, sensory change, speech change, focal weakness and headaches.    The problem list and medications were reviewed and updated by myself where necessary  and exist elsewhere in the encounter.   OBJECTIVE:  BP 120/69   Pulse 65   Temp 98.4 F (36.9 C) (Oral)   Resp 18   Ht 5\' 3"  (1.6 m)   Wt 199 lb 12.8 oz (90.6 kg)   SpO2 97%   BMI 35.39 kg/m   Wt Readings from Last 3 Encounters:  12/10/17 199 lb 12.8 oz (90.6 kg)  03/19/17 201 lb (91.2 kg)  10/09/16 213 lb 9.6 oz (96.9 kg)   Temp Readings from Last 3 Encounters:  12/10/17 98.4 F (36.9 C) (Oral)  03/19/17 98.4 F (36.9 C) (Oral)  10/09/16 98 F (36.7 C) (Oral)   BP Readings from Last 3 Encounters:  12/10/17 120/69  03/19/17 110/69  10/09/16 119/71   Pulse Readings from Last 3 Encounters:  12/10/17 65  03/19/17 60  10/09/16 (!) 54    Physical Exam  Constitutional: She is oriented to person, place, and time. She appears well-developed and well-nourished.  Non-toxic appearance. No distress.  HENT:  Right Ear: External ear normal.  Left Ear: External ear normal.  Nose: Mucosal edema present. Right sinus exhibits no maxillary sinus tenderness and no frontal sinus tenderness. Left sinus exhibits no maxillary sinus tenderness and no frontal sinus tenderness.  Mouth/Throat: Oropharynx is clear and moist. No oropharyngeal exudate.  Eyes: Pupils are equal, round, and reactive to light. Conjunctivae and EOM are normal.  Cardiovascular: Normal rate, regular rhythm, S1 normal, S2 normal, normal heart sounds and intact distal pulses. Exam reveals no gallop, no friction rub and no decreased pulses.  No murmur  heard. Pulmonary/Chest: Effort normal and breath sounds normal. No stridor. No respiratory distress. She has no wheezes. She has no rales.  Abdominal: She exhibits no distension.  Musculoskeletal: Normal range of motion. She exhibits no edema.       Right knee: She exhibits normal range of motion, no swelling, no effusion, no ecchymosis, no deformity, no laceration, no erythema, normal alignment, no LCL laxity, normal patellar mobility, no bony tenderness, normal meniscus  and no MCL laxity. Tenderness found. Medial joint line tenderness noted. No lateral joint line, no MCL, no LCL and no patellar tendon tenderness noted.       Hands:      Legs: Neurological: She is alert and oriented to person, place, and time. No cranial nerve deficit. Gait normal.  Skin: Skin is warm and dry. No rash noted. She is not diaphoretic. No erythema. No pallor.  Psychiatric: She has a normal mood and affect. Her behavior is normal.  Vitals reviewed.   Lab Results  Component Value Date   HGBA1C 5.8 (H) 03/19/2017    Lab Results  Component Value Date   WBC 7.5 10/09/2016   HGB 11.9 10/09/2016   HCT 37.1 10/09/2016   MCV 89 10/09/2016   PLT 330 10/09/2016    Lab Results  Component Value Date   CREATININE 0.74 10/09/2016   BUN 9 10/09/2016   NA 144 10/09/2016   K 4.7 10/09/2016   CL 103 10/09/2016   CO2 24 10/09/2016    Lab Results  Component Value Date   ALT 17 10/09/2016   AST 17 10/09/2016   ALKPHOS 81 10/09/2016   BILITOT <0.2 10/09/2016    Lab Results  Component Value Date   TSH 2.510 10/09/2016    Lab Results  Component Value Date   CHOL 228 (H) 03/19/2017   HDL 61 03/19/2017   LDLCALC 146 (H) 03/19/2017   TRIG 105 03/19/2017   CHOLHDL 3.7 03/19/2017   The 10-year ASCVD risk score Denman George DC Jr., et al., 2013) is: 2.1%   Values used to calculate the score:     Age: 60 years     Sex: Female     Is Non-Hispanic African American: Yes     Diabetic: No     Tobacco smoker: No     Systolic Blood Pressure: 120 mmHg     Is BP treated: No     HDL Cholesterol: 61 mg/dL     Total Cholesterol: 228 mg/dL   ASSESSMENT AND PLAN:  Allison Wright was seen today for annual exam.  Diagnoses and all orders for this visit:  Annual physical exam  Need for shingles vaccine -     Zoster Vaccine Adjuvanted [facility]) injection; Inject 0.5 mLs into the muscle once for 1 dose. Due for booster in 6 months.  Screening for lipid disorders -     Hemoglobin  A1c  Screening cholesterol level -     Lipid panel  Screening for nephropathy -     CMP and Liver  Encounter for screening for nutritional disorder -     Vitamin D, 25-hydroxy -     Iron  Chronic pain of right knee Comments: Waxes and wanes.  Improves with exericse.  Likely mild OA.  See avs.   Viral wart on finger Comments: Frozen here.  Zantac 75 qhs. RTC in three weeks if needed.     The patient is advised to call or return to clinic if she does not see an improvement in symptoms, or  to seek the care of the closest emergency department if she worsens with the above plan.   Deliah Boston, MHS, PA-C [facility] 12/10/2017 3:00 PM

## 2017-12-11 ENCOUNTER — Telehealth: Payer: Self-pay | Admitting: Physician Assistant

## 2017-12-11 LAB — LIPID PANEL
CHOL/HDL RATIO: 3.1 ratio (ref 0.0–4.4)
Cholesterol, Total: 249 mg/dL — ABNORMAL HIGH (ref 100–199)
HDL: 81 mg/dL (ref >39)
LDL Calculated: 153 mg/dL — ABNORMAL HIGH (ref 0–99)
Triglycerides: 75 mg/dL (ref 0–149)
VLDL CHOLESTEROL CAL: 15 mg/dL (ref 5–40)

## 2017-12-11 LAB — CMP AND LIVER
ALK PHOS: 90 IU/L (ref 39–117)
ALT: 25 IU/L (ref 0–32)
AST: 22 IU/L (ref 0–40)
Albumin: 4.3 g/dL (ref 3.5–5.5)
BILIRUBIN, DIRECT: 0.07 mg/dL (ref 0.00–0.40)
BUN: 8 mg/dL (ref 6–24)
Bilirubin Total: <0.2 mg/dL (ref 0.0–1.2)
CO2: 25 mmol/L (ref 20–29)
CREATININE: 0.71 mg/dL (ref 0.57–1.00)
Calcium: 9.1 mg/dL (ref 8.7–10.2)
Chloride: 102 mmol/L (ref 96–106)
GFR calc Af Amer: 112 mL/min/{1.73_m2} (ref >59)
GFR calc non Af Amer: 98 mL/min/{1.73_m2} (ref >59)
Glucose: 78 mg/dL (ref 65–99)
Potassium: 4.6 mmol/L (ref 3.5–5.2)
SODIUM: 142 mmol/L (ref 134–144)
Total Protein: 7.6 g/dL (ref 6.0–8.5)

## 2017-12-11 LAB — HEMOGLOBIN A1C
ESTIMATED AVERAGE GLUCOSE: 126 mg/dL
HEMOGLOBIN A1C: 6 % — AB (ref 4.8–5.6)

## 2017-12-11 LAB — VITAMIN D 25 HYDROXY (VIT D DEFICIENCY, FRACTURES): VIT D 25 HYDROXY: 19.6 ng/mL — AB (ref 30.0–100.0)

## 2017-12-11 LAB — IRON: IRON: 42 ug/dL (ref 27–159)

## 2017-12-11 MED ORDER — VITAMIN D (ERGOCALCIFEROL) 1.25 MG (50000 UNIT) PO CAPS: 50000.0000 [IU] | ORAL_CAPSULE | ORAL | 2 refills | Status: DC

## 2017-12-11 NOTE — Telephone Encounter (Signed)
Please call and advise that she needs to be taking vitamin D.  I have prescribed enough to last three months total. Deliah BostonMichael Marlen Koman, MS, PA-C 2:01 PM, 12/11/2017

## 2017-12-12 ENCOUNTER — Encounter: Payer: Federal, State, Local not specified - PPO | Admitting: Physician Assistant

## 2017-12-27 ENCOUNTER — Telehealth: Payer: Self-pay | Admitting: Podiatry

## 2017-12-27 DIAGNOSIS — Z1231 Encounter for screening mammogram for malignant neoplasm of breast: Secondary | ICD-10-CM | POA: Diagnosis not present

## 2017-12-27 DIAGNOSIS — Z6834 Body mass index (BMI) 34.0-34.9, adult: Secondary | ICD-10-CM | POA: Diagnosis not present

## 2017-12-27 MED ORDER — MELOXICAM 7.5 MG PO TABS: 7.5000 mg | ORAL_TABLET | Freq: Every day | ORAL | 0 refills | Status: DC

## 2017-12-27 NOTE — Telephone Encounter (Signed)
Left message informing pt the meloxicam had been refilled and she would need an appt prior to future refills if she was continuing to have pain.

## 2017-12-27 NOTE — Telephone Encounter (Signed)
Patient would like to refill meloxicam (MOBIC) 7.5 MG tablet. Please call patient back at [phone]

## 2017-12-27 NOTE — Addendum Note (Signed)
Addended by: Alphia Kava'CONNELL, VALERY D on: 12/27/2017 11:22 AM   Modules accepted: Orders

## 2017-12-31 ENCOUNTER — Other Ambulatory Visit: Payer: Self-pay

## 2017-12-31 ENCOUNTER — Encounter: Payer: Self-pay | Admitting: Emergency Medicine

## 2017-12-31 ENCOUNTER — Ambulatory Visit: Payer: Federal, State, Local not specified - PPO | Admitting: Emergency Medicine

## 2017-12-31 VITALS — BP 121/72 | HR 54 | Temp 98.3°F | Resp 18 | Ht 63.0 in | Wt 200.4 lb

## 2017-12-31 DIAGNOSIS — B079 Viral wart, unspecified: Secondary | ICD-10-CM | POA: Diagnosis not present

## 2017-12-31 NOTE — Patient Instructions (Addendum)
     IF you received an x-ray today, you will receive an invoice from [facility]. Please contact [facility] at [phone] with questions or concerns regarding your invoice.   IF you received labwork today, you will receive an invoice from [facility]. Please contact [facility] at [phone] with questions or concerns regarding your invoice.   Our billing staff will not be able to assist you with questions regarding bills from these companies.  You will be contacted with the lab results as soon as they are available. The fastest way to get your results is to activate your My Chart account. Instructions are located on the last page of this paperwork. If you have not heard from us regarding the results in 2 weeks, please contact this office.     Warts Warts are small growths on the skin. They are common, and they are caused by a type of germ (virus). Warts can occur on many areas of the body. A person may have one wart or more than one wart. Warts can spread if you scratch a wart and then scratch normal skin. Most warts will go away over many months to a couple years. Treatments may be done if needed. Follow these instructions at home:  Apply over-the-counter and prescription medicines only as told by your doctor.  Do not apply over-the-counter wart medicines to your face or genitals before you ask your doctor if it is okay to do that.  Do not scratch or pick at a wart.  Wash your hands after you touch a wart.  Avoid shaving hair that is over a wart.  Keep all follow-up visits as told by your doctor. This is important. Contact a doctor if:  Your warts do not improve after treatment.  You have redness, swelling, or pain at the site of a wart.  You have bleeding from a wart, and the bleeding does not stop when you put light pressure on the wart.  You have diabetes and you get a wart. This information is not intended to replace advice given to you by your health care  provider. Make sure you discuss any questions you have with your health care provider. Document Released: 09/15/2010 Document Revised: 10/21/2015 Document Reviewed: 08/10/2014 Elsevier Interactive Patient Education  [employer]2018 [employer]

## 2017-12-31 NOTE — Progress Notes (Signed)
Allison Wright 52 y.o.   Chief Complaint  Patient presents with  . wart on finger    follow up not gone     HISTORY OF PRESENT ILLNESS: This is a 52 y.o. female complaining of persistent wart on her right index finger.  Seen here on 12/10/2017 and had cryotherapy but did not work.  She has had this wart for at least 5 years..  No other significant symptoms.  HPI   Prior to Admission medications   Medication Sig Start Date End Date Taking? Authorizing Provider  ibuprofen (ADVIL,MOTRIN) 800 MG tablet Take 1 tablet (800 mg total) by mouth every 8 (eight) hours as needed. 10/05/16  Yes Allison Grim, MD  meloxicam (MOBIC) 7.5 MG tablet Take 1 tablet (7.5 mg total) by mouth daily. 12/27/17  Yes Allison Wright, DPM  Vitamin D, Ergocalciferol, (DRISDOL) 50000 units CAPS capsule Take 1 capsule (50,000 Units total) by mouth every 7 (seven) days. 12/11/17  Yes Allison Neas, PA-C    No Known Allergies  Patient Active Problem List   Diagnosis Date Noted  . Stress fracture of left calcaneus 08/28/2017  . Prediabetes 10/12/2016  . Overweight 10/12/2016  . Systolic ejection murmur 10/09/2016    Past Allison History:  Diagnosis Date  . Allergy   . Heart murmur   . Post-menopause     Past Surgical History:  Procedure Laterality Date  . ABDOMINAL HYSTERECTOMY  2001   Retained cervix    Social History   Socioeconomic History  . Marital status: Single    Spouse name: Not on file  . Number of children: Not on file  . Years of education: Not on file  . Highest education level: Not on file  Occupational History  . Not on file  Social Needs  . Financial resource strain: Not on file  . Food insecurity:    Worry: Not on file    Inability: Not on file  . Transportation needs:    Allison: Not on file    Non-Allison: Not on file  Tobacco Use  . Smoking status: Former Smoker    Packs/day: 0.50    Years: 10.00    Pack years: 5.00    Last attempt to quit: 05/29/1996   Years since quitting: 21.6  . Smokeless tobacco: Never Used  Substance and Sexual Activity  . Alcohol use: No    Alcohol/week: 0.0 oz  . Drug use: No  . Sexual activity: Never  Lifestyle  . Physical activity:    Days per week: Not on file    Minutes per session: Not on file  . Stress: Not on file  Relationships  . Social connections:    Talks on phone: Not on file    Gets together: Not on file    Attends religious service: Not on file    Active member of club or organization: Not on file    Attends meetings of clubs or organizations: Not on file    Relationship status: Not on file  . Intimate partner violence:    Fear of current or ex partner: Not on file    Emotionally abused: Not on file    Physically abused: Not on file    Forced sexual activity: Not on file  Other Topics Concern  . Not on file  Social History Narrative  . Not on file    Family History  Problem Relation Age of Onset  . Cirrhosis Mother   . Alcohol abuse Mother   .  Diabetes Father [id number]  . Heart attack Father   . Heart disease Paternal Grandmother      Review of Systems  Constitutional: Negative for chills and fever.  Skin: Negative for rash.  All other systems reviewed and are negative.     Vitals:   12/31/17 1718  BP: 121/72  Pulse: (!) 54  Resp: 18  Temp: 98.3 F (36.8 C)  SpO2: 99%    Physical Exam  Constitutional: She is oriented to person, place, and time. She appears well-developed and well-nourished.  Eyes: Pupils are equal, round, and reactive to light. EOM are normal.  Neck: Normal range of motion. Neck supple.  Cardiovascular: Normal rate.  Pulmonary/Chest: Effort normal.  Musculoskeletal: Normal range of motion.  Neurological: She is alert and oriented to person, place, and time.  Skin:  Right index finger.  Distal wart on palmar surface.  No infection.  Psychiatric: She has a normal mood and affect. Her behavior is normal.  Vitals reviewed.    ASSESSMENT &  PLAN: Allison Wright was seen today for wart on finger.  Diagnoses and all orders for this visit:  Viral wart on finger -     Ambulatory referral to Dermatology    Patient Instructions       IF you received an x-ray today, you will receive an invoice from [facility]. Please contact [facility] at [phone] with questions or concerns regarding your invoice.   IF you received labwork today, you will receive an invoice from [facility]. Please contact Allison Wright at [phone] with questions or concerns regarding your invoice.   Our billing staff will not be able to assist you with questions regarding bills from these companies.  You will be contacted with the lab results as soon as they are available. The fastest way to get your results is to activate your My Chart account. Instructions are located on the last page of this paperwork. If you have not heard from us regarding the results in 2 weeks, please contact this office.     Warts Warts are small growths on the skin. They are common, and they are caused by a type of germ (virus). Warts can occur on many areas of the body. A person may have one wart or more than one wart. Warts can spread if you scratch a wart and then scratch normal skin. Most warts will go away over many months to a couple years. Treatments may be done if needed. Follow these instructions at home:  Apply over-the-counter and prescription medicines only as told by your doctor.  Do not apply over-the-counter wart medicines to your face or genitals before you ask your doctor if it is okay to do that.  Do not scratch or pick at a wart.  Wash your hands after you touch a wart.  Avoid shaving hair that is over a wart.  Keep all follow-up visits as told by your doctor. This is important. Contact a doctor if:  Your warts do not improve after treatment.  You have redness, swelling, or pain at the site of a wart.  You have bleeding from a wart, and  the bleeding does not stop when you put light pressure on the wart.  You have diabetes and you get a wart. This information is not intended to replace advice given to you by your health care provider. Make sure you discuss any questions you have with your health care provider. Document Released: 09/15/2010 Document Revised: 10/21/2015 Document Reviewed: 08/10/2014 Elsevier Interactive Patient Education  2018 Elsevier Inc.      Agustina Caroli, MD [facility]

## 2018-01-29 ENCOUNTER — Ambulatory Visit: Payer: Self-pay | Admitting: *Deleted

## 2018-01-29 ENCOUNTER — Other Ambulatory Visit: Payer: Self-pay | Admitting: Podiatry

## 2018-01-29 NOTE — Telephone Encounter (Signed)
Pt has been experiencing dizziness/lightheadedness since Monday of last week. Pt states that the lightheaded feeling is still there but she was dizzy last week and her balance was off. Pt states that on Sunday she felt like the room was spinning which required her to lie down. Pt denies any other symptoms at this time. Pt scheduled for appt on 9/4 and advised if symptoms become worse before scheduled appt to return call to the office. Pt verbalized understanding.  Reason for Disposition . [1] MILD dizziness (e.g., walking normally) AND [2] has NOT been evaluated by physician for this  (Exception: dizziness caused by heat exposure, sudden standing, or poor fluid intake)  Answer Assessment - Initial Assessment Questions 1. DESCRIPTION: "Describe your dizziness."     Lightheaded, comes and goes; Dizzy last week and felt that balance as off 2. LIGHTHEADED: "Do you feel lightheaded?" (e.g., somewhat faint, woozy, weak upon standing)     yes 3. VERTIGO: "Do you feel like either you or the room is spinning or tilting?" (i.e. vertigo)     Felt like the room was spinning  4. SEVERITY: "How bad is it?"  "Do you feel like you are going to faint?" "Can you stand and walk?"   - MILD - walking normally   - MODERATE - interferes with normal activities (e.g., work, school)    - SEVERE - unable to stand, requires support to walk, feels like passing out now.      mild 5. ONSET:  "When did the dizziness begin?"     A week ago on Monday 6. AGGRAVATING FACTORS: "Does anything make it worse?" (e.g., standing, change in head position)     No 7. HEART RATE: "Can you tell me your heart rate?" "How many beats in 15 seconds?"  (Note: not all patients can do this)      Unable to do 8. CAUSE: "What do you think is causing the dizziness?"     I have no idea 9. RECURRENT SYMPTOM: "Have you had dizziness before?" If so, ask: "When was the last time?" "What happened that time?"     No 10. OTHER SYMPTOMS: "Do you have any  other symptoms?" (e.g., fever, chest pain, vomiting, diarrhea, bleeding)       No 11. PREGNANCY: "Is there any chance you are pregnant?" "When was your last menstrual period?"       No chance of pregnancy,Partial hysterectomy in 2001  Protocols used: DIZZINESS [facility]

## 2018-01-30 ENCOUNTER — Ambulatory Visit: Payer: Federal, State, Local not specified - PPO | Admitting: Emergency Medicine

## 2018-01-30 ENCOUNTER — Other Ambulatory Visit: Payer: Self-pay

## 2018-01-30 ENCOUNTER — Encounter: Payer: Self-pay | Admitting: Emergency Medicine

## 2018-01-30 VITALS — BP 118/68 | HR 60 | Temp 97.9°F | Resp 16 | Ht 62.75 in | Wt 201.2 lb

## 2018-01-30 DIAGNOSIS — R42 Dizziness and giddiness: Secondary | ICD-10-CM | POA: Diagnosis not present

## 2018-01-30 LAB — COMPREHENSIVE METABOLIC PANEL
ALBUMIN: 4.1 g/dL (ref 3.5–5.5)
ALK PHOS: 86 IU/L (ref 39–117)
ALT: 16 IU/L (ref 0–32)
AST: 15 IU/L (ref 0–40)
Albumin/Globulin Ratio: 1.3 (ref 1.2–2.2)
BUN/Creatinine Ratio: 16 (ref 9–23)
BUN: 12 mg/dL (ref 6–24)
Bilirubin Total: <0.2 mg/dL (ref 0.0–1.2)
CO2: 23 mmol/L (ref 20–29)
CREATININE: 0.76 mg/dL (ref 0.57–1.00)
Calcium: 9.3 mg/dL (ref 8.7–10.2)
Chloride: 101 mmol/L (ref 96–106)
GFR, EST AFRICAN AMERICAN: 104 mL/min/{1.73_m2} (ref >59)
GFR, EST NON AFRICAN AMERICAN: 90 mL/min/{1.73_m2} (ref >59)
GLOBULIN, TOTAL: 3.1 g/dL (ref 1.5–4.5)
GLUCOSE: 88 mg/dL (ref 65–99)
Potassium: 4.6 mmol/L (ref 3.5–5.2)
SODIUM: 139 mmol/L (ref 134–144)
TOTAL PROTEIN: 7.2 g/dL (ref 6.0–8.5)

## 2018-01-30 LAB — POCT CBC
GRANULOCYTE PERCENT: 56.8 % (ref 37–80)
HCT, POC: 35.8 % — AB (ref 37.7–47.9)
Hemoglobin: 11.7 g/dL — AB (ref 12.2–16.2)
Lymph, poc: 3.4 (ref 0.6–3.4)
MCH, POC: 28 pg (ref 27–31.2)
MCHC: 32.7 g/dL (ref 31.8–35.4)
MCV: 85.7 fL (ref 80–97)
MID (CBC): 0.3 (ref 0–0.9)
MPV: 8.6 fL (ref 0–99.8)
PLATELET COUNT, POC: 370 10*3/uL (ref 142–424)
POC Granulocyte: 4.9 (ref 2–6.9)
POC LYMPH PERCENT: 39.7 %L (ref 10–50)
POC MID %: 3.5 %M (ref 0–12)
RBC: 4.17 M/uL (ref 4.04–5.48)
RDW, POC: 15.2 %
WBC: 8.6 10*3/uL (ref 4.6–10.2)

## 2018-01-30 LAB — POCT URINALYSIS DIP (MANUAL ENTRY)
Bilirubin, UA: NEGATIVE
Blood, UA: NEGATIVE
GLUCOSE UA: NEGATIVE mg/dL
Ketones, POC UA: NEGATIVE mg/dL
Leukocytes, UA: NEGATIVE
NITRITE UA: NEGATIVE
PH UA: 7 (ref 5.0–8.0)
PROTEIN UA: NEGATIVE mg/dL
SPEC GRAV UA: 1.01 (ref 1.010–1.025)
UROBILINOGEN UA: 0.2 U/dL

## 2018-01-30 LAB — HEMOGLOBIN A1C
Est. average glucose Bld gHb Est-mCnc: 123 mg/dL
HEMOGLOBIN A1C: 5.9 % — AB (ref 4.8–5.6)

## 2018-01-30 MED ORDER — MECLIZINE HCL 25 MG PO TABS: 25.0000 mg | ORAL_TABLET | Freq: Three times a day (TID) | ORAL | 0 refills | Status: DC | PRN

## 2018-01-30 NOTE — Patient Instructions (Addendum)
If you have lab work done today you will be contacted with your lab results within the next 2 weeks.  If you have not heard from [place] then please contact us. The fastest way to get your results is to register for My Chart.   IF you received an x-ray today, you will receive an invoice from [facility]. Please contact [facility] at [phone] with questions or concerns regarding your invoice.   IF you received labwork today, you will receive an invoice from [facility]. Please contact [facility] at [phone] with questions or concerns regarding your invoice.   Our billing staff will not be able to assist you with questions regarding bills from these companies.  You will be contacted with the lab results as soon as they are available. The fastest way to get your results is to activate your My Chart account. Instructions are located on the last page of this paperwork. If you have not heard from [place] regarding the results in 2 weeks, please contact this office.    Dizziness Dizziness is a common problem. It makes you feel unsteady or light-headed. You may feel like you are about to pass out (faint). Dizziness can lead to getting hurt if you stumble or fall. Dizziness can be caused by many things, including:  Medicines.  Not having enough water in your body (dehydration).  Illness.  Follow these instructions at home: Eating and drinking  Drink enough fluid to keep your pee (urine) clear or pale yellow. This helps to keep you from getting dehydrated. Try to drink more clear fluids, such as water.  Do not drink alcohol.  Limit how much caffeine you drink or eat, if your doctor tells you to do that.  Limit how much salt (sodium) you drink or eat, if your doctor tells you to do that. Activity  Avoid making quick movements. ? When you stand up from sitting in a chair, steady yourself until you feel okay. ? In the morning, first sit up on the side of the bed. When you  feel okay, stand slowly while you hold onto something. Do this until you know that your balance is fine.  If you need to stand in one place for a long time, move your legs often. Tighten and relax the muscles in your legs while you are standing.  Do not drive or use heavy machinery if you feel dizzy.  Avoid bending down if you feel dizzy. Place items in your home so you can reach them easily without leaning over. Lifestyle  Do not use any products that contain nicotine or tobacco, such as cigarettes and e-cigarettes. If you need help quitting, ask your doctor.  Try to lower your stress level. You can do this by using methods such as yoga or meditation. Talk with your doctor if you need help. General instructions  Watch your dizziness for any changes.  Take over-the-counter and prescription medicines only as told by your doctor. Talk with your doctor if you think that you are dizzy because of a medicine that you are taking.  Tell a friend or a family member that you are feeling dizzy. If he or she notices any changes in your behavior, have this person call your doctor.  Keep all follow-up visits as told by your doctor. This is important. Contact a doctor if:  Your dizziness does not go away.  Your dizziness or light-headedness gets worse.  You feel sick to your stomach (nauseous).  You have trouble  hearing.  You have new symptoms.  You are unsteady on your feet.  You feel like the room is spinning. Get help right away if:  You throw up (vomit) or have watery poop (diarrhea), and you cannot eat or drink anything.  You have trouble: ? Talking. ? Walking. ? Swallowing. ? Using your arms, hands, or legs.  You feel generally weak.  You are not thinking clearly, or you have trouble forming sentences. A friend or family member may notice this.  You have: ? Chest pain. ? Pain in your belly (abdomen). ? Shortness of breath. ? Sweating.  Your vision changes.  You are  bleeding.  You have a very bad headache.  You have neck pain or a stiff neck.  You have a fever. These symptoms may be an emergency. Do not wait to see if the symptoms will go away. Get medical help right away. Call your local emergency services (911 in the [place] Do not drive yourself to the hospital. Summary  Dizziness makes you feel unsteady or light-headed. You may feel like you are about to pass out (faint).  Drink enough fluid to keep your pee (urine) clear or pale yellow. Do not drink alcohol.  Avoid making quick movements if you feel dizzy.  Watch your dizziness for any changes. This information is not intended to replace advice given to you by your health care provider. Make sure you discuss any questions you have with your health care provider. Document Released: 05/04/2011 Document Revised: 06/01/2016 Document Reviewed: 06/01/2016 Elsevier Interactive Patient Education  2017 Elsevier Inc.  

## 2018-01-30 NOTE — Progress Notes (Signed)
Nurses Call read as follows: Pt has been experiencing dizziness/lightheadedness since Monday of last week. Pt states that the lightheaded feeling is still there but she was dizzy last week and her balance was off. Pt states that on Sunday she felt like the room was spinning which required her to lie down. Pt denies any other symptoms at this time. Pt scheduled for appt on 9/4 and advised if symptoms become worse before scheduled appt to return call to the office. Pt verbalized understanding.   Allison Wright 51 y.o. complaining of feeling dizzy since last week but worse the past 3 to 4 days.  Last Saturday night she had vertigo episode when she had to lie down.  Room was spinning.  Felt "like a drunk".  Denies chest pain or difficulty breathing.  No new medications.  Denies any flulike symptoms, fever or chills.  No increased stress or depression.  Works indoors.  No new diet or lack of hydration.  Denies any abdominal pain, diarrhea, nausea or vomiting.  Denies rectal bleeding or melena.  Drove herself here.  No motion sickness her balance has been off at times.    Chief Complaint  Patient presents with  . Dizziness    per patient 3-4 days  . Headache    HISTORY OF PRESENT ILLNESS:See above. This is a 52 y.o. female  HPI   Prior to Admission medications   Medication Sig Start Date End Date Taking? Authorizing Provider  ibuprofen (ADVIL,MOTRIN) 800 MG tablet Take 1 tablet (800 mg total) by mouth every 8 (eight) hours as needed. 10/05/16  Yes Tobey Grim, MD  meloxicam (MOBIC) 7.5 MG tablet TAKE 1 TABLET BY MOUTH EVERY DAY 01/29/18  Yes Vivi Barrack, DPM  Vitamin D, Ergocalciferol, (DRISDOL) 50000 units CAPS capsule Take 1 capsule (50,000 Units total) by mouth every 7 (seven) days. 12/11/17  Yes Ofilia Neas, PA-C    No Known Allergies  Patient Active Problem List   Diagnosis Date Noted  . Stress fracture of left calcaneus 08/28/2017  . Prediabetes 10/12/2016  .  Overweight 10/12/2016  . Systolic ejection murmur 10/09/2016    Past Medical History:  Diagnosis Date  . Allergy   . Heart murmur   . Post-menopause     Past Surgical History:  Procedure Laterality Date  . ABDOMINAL HYSTERECTOMY  2001   Retained cervix    Social History   Socioeconomic History  . Marital status: Single    Spouse name: Not on file  . Number of children: Not on file  . Years of education: Not on file  . Highest education level: Not on file  Occupational History  . Not on file  Social Needs  . Financial resource strain: Not on file  . Food insecurity:    Worry: Not on file    Inability: Not on file  . Transportation needs:    Medical: Not on file    Non-medical: Not on file  Tobacco Use  . Smoking status: Former Smoker    Packs/day: 0.50    Years: 10.00    Pack years: 5.00    Last attempt to quit: 05/29/1996    Years since quitting: 21.6  . Smokeless tobacco: Never Used  Substance and Sexual Activity  . Alcohol use: No    Alcohol/week: 0.0 standard drinks  . Drug use: No  . Sexual activity: Never  Lifestyle  . Physical activity:    Days per week: Not on file    Minutes  per session: Not on file  . Stress: Not on file  Relationships  . Social connections:    Talks on phone: Not on file    Gets together: Not on file    Attends religious service: Not on file    Active member of club or organization: Not on file    Attends meetings of clubs or organizations: Not on file    Relationship status: Not on file  . Intimate partner violence:    Fear of current or ex partner: Not on file    Emotionally abused: Not on file    Physically abused: Not on file    Forced sexual activity: Not on file  Other Topics Concern  . Not on file  Social History Narrative  . Not on file    Family History  Problem Relation Age of Onset  . Cirrhosis Mother   . Alcohol abuse Mother   . Diabetes Father 83  . Heart attack Father   . Heart disease Paternal  Grandmother      Review of Systems  Constitutional: Negative.  Negative for chills, fever and weight loss.  HENT: Negative.  Negative for congestion, hearing loss, nosebleeds, sinus pain and sore throat.   Eyes: Negative.  Negative for blurred vision and double vision.  Respiratory: Negative.  Negative for cough and shortness of breath.   Cardiovascular: Negative.  Negative for chest pain, palpitations and leg swelling.  Gastrointestinal: Negative.  Negative for abdominal pain, blood in stool, diarrhea, melena, nausea and vomiting.  Genitourinary: Negative.  Negative for dysuria and hematuria.  Musculoskeletal: Negative.  Negative for back pain, myalgias and neck pain.  Skin: Negative.  Negative for rash.  Neurological: Positive for dizziness and headaches. Negative for sensory change, speech change, focal weakness, seizures and loss of consciousness.   Vitals:   01/30/18 1014  BP: 118/68  Pulse: 60  Resp: 16  Temp: 97.9 F (36.6 C)  SpO2: 99%   Orthostatic VS for the past 24 hrs (Last 3 readings):  BP- Lying Pulse- Lying BP- Sitting Pulse- Sitting BP- Standing at 0 minutes Pulse- Standing at 0 minutes  01/30/18 1051 119/81 52 127/81 53 117/80 57     Physical Exam  Constitutional: She is oriented to person, place, and time. She appears well-developed and well-nourished.  HENT:  Head: Normocephalic and atraumatic.  Right Ear: External ear normal.  Left Ear: External ear normal.  Nose: Nose normal.  Mouth/Throat: Oropharynx is clear and moist.  Eyes: Pupils are equal, round, and reactive to light. Conjunctivae and EOM are normal.  Neck: Normal range of motion. Neck supple. No JVD present. No thyromegaly present.  Cardiovascular: Normal rate, regular rhythm, normal heart sounds and intact distal pulses.  Pulmonary/Chest: Effort normal and breath sounds normal.  Abdominal: Soft. Bowel sounds are normal. She exhibits no distension. There is no tenderness.  Musculoskeletal:  Normal range of motion. She exhibits no edema or tenderness.  Lymphadenopathy:    She has no cervical adenopathy.  Neurological: She is alert and oriented to person, place, and time. She displays normal reflexes. No cranial nerve deficit or sensory deficit. She exhibits normal muscle tone. Coordination normal.  Skin: Skin is warm and dry. Capillary refill takes less than 2 seconds. No rash noted.  Psychiatric: She has a normal mood and affect. Her behavior is normal.  Vitals reviewed.  EKG: Sinus bradycardia with ventricular rate of 52.  No acute ischemic changes.  Results for orders placed or performed in visit on  01/30/18 (from the past 24 hour(s))  POCT CBC     Status: Abnormal   Collection Time: 01/30/18 10:50 AM  Result Value Ref Range   WBC 8.6 4.6 - 10.2 K/uL   Lymph, poc 3.4 0.6 - 3.4   POC LYMPH PERCENT 39.7 10 - 50 %L   MID (cbc) 0.3 0 - 0.9   POC MID % 3.5 0 - 12 %M   POC Granulocyte 4.9 2 - 6.9   Granulocyte percent 56.8 37 - 80 %G   RBC 4.17 4.04 - 5.48 M/uL   Hemoglobin 11.7 (A) 12.2 - 16.2 g/dL   HCT, POC 16.1 (A) 09.6 - 47.9 %   MCV 85.7 80 - 97 fL   MCH, POC 28.0 27 - 31.2 pg   MCHC 32.7 31.8 - 35.4 g/dL   RDW, POC 04.5 %   Platelet Count, POC 370 142 - 424 K/uL   MPV 8.6 0 - 99.8 fL  POCT urinalysis dipstick     Status: None   Collection Time: 01/30/18 11:15 AM  Result Value Ref Range   Color, UA yellow yellow   Clarity, UA clear clear   Glucose, UA negative negative mg/dL   Bilirubin, UA negative negative   Ketones, POC UA negative negative mg/dL   Spec Grav, UA 4.098 1.191 - 1.025   Blood, UA negative negative   pH, UA 7.0 5.0 - 8.0   Protein Ur, POC negative negative mg/dL   Urobilinogen, UA 0.2 0.2 or 1.0 E.U./dL   Nitrite, UA Negative Negative   Leukocytes, UA Negative Negative   BP Readings from Last 3 Encounters:  01/30/18 118/68  12/31/17 121/72  12/10/17 120/69   Dizziness Clinically stable.  Normal physical exam.  No red flag signs or  symptoms.  Normal work-up today.  Not orthostatic.  Normal EKG, unremarkable CBC, normal urinalysis.  CMP pending.  No CVA findings.  Will monitor symptoms closely.  Advised to take meclizine as needed for symptom relief.  Follow-up in a few days if condition changes or worse.   ASSESSMENT & PLAN: Ashleigh was seen today for dizziness and headache.  Diagnoses and all orders for this visit:  Dizziness -     POCT CBC -     POCT urinalysis dipstick -     EKG 12-Lead -     Comprehensive metabolic panel -     Hemoglobin A1c -     Orthostatic vital signs -     meclizine (ANTIVERT) 25 MG tablet; Take 1 tablet (25 mg total) by mouth 3 (three) times daily as needed for dizziness.   Patient Instructions       If you have lab work done today you will be contacted with your lab results within the next 2 weeks.  If you have not heard from [place] then please contact us. The fastest way to get your results is to register for My Chart.   IF you received an x-ray today, you will receive an invoice from [facility]. Please contact [facility] at [phone] with questions or concerns regarding your invoice.   IF you received labwork today, you will receive an invoice from [place]. Please contact [facility] at [phone] with questions or concerns regarding your invoice.   Our billing staff will not be able to assist you with questions regarding bills from these companies.  You will be contacted with the lab results as soon as they are available. The fastest way to get your results is to activate  your My Chart account. Instructions are located on the last page of this paperwork. If you have not heard from [place] regarding the results in 2 weeks, please contact this office.    Dizziness Dizziness is a common problem. It makes you feel unsteady or light-headed. You may feel like you are about to pass out (faint). Dizziness can lead to getting hurt if you stumble or fall. Dizziness can be  caused by many things, including:  Medicines.  Not having enough water in your body (dehydration).  Illness.  Follow these instructions at home: Eating and drinking  Drink enough fluid to keep your pee (urine) clear or pale yellow. This helps to keep you from getting dehydrated. Try to drink more clear fluids, such as water.  Do not drink alcohol.  Limit how much caffeine you drink or eat, if your doctor tells you to do that.  Limit how much salt (sodium) you drink or eat, if your doctor tells you to do that. Activity  Avoid making quick movements. ? When you stand up from sitting in a chair, steady yourself until you feel okay. ? In the morning, first sit up on the side of the bed. When you feel okay, stand slowly while you hold onto something. Do this until you know that your balance is fine.  If you need to stand in one place for a long time, move your legs often. Tighten and relax the muscles in your legs while you are standing.  Do not drive or use heavy machinery if you feel dizzy.  Avoid bending down if you feel dizzy. Place items in your home so you can reach them easily without leaning over. Lifestyle  Do not use any products that contain nicotine or tobacco, such as cigarettes and e-cigarettes. If you need help quitting, ask your doctor.  Try to lower your stress level. You can do this by using methods such as yoga or meditation. Talk with your doctor if you need help. General instructions  Watch your dizziness for any changes.  Take over-the-counter and prescription medicines only as told by your doctor. Talk with your doctor if you think that you are dizzy because of a medicine that you are taking.  Tell a friend or a family member that you are feeling dizzy. If he or she notices any changes in your behavior, have this person call your doctor.  Keep all follow-up visits as told by your doctor. This is important. Contact a doctor if:  Your dizziness does not go  away.  Your dizziness or light-headedness gets worse.  You feel sick to your stomach (nauseous).  You have trouble hearing.  You have new symptoms.  You are unsteady on your feet.  You feel like the room is spinning. Get help right away if:  You throw up (vomit) or have watery poop (diarrhea), and you cannot eat or drink anything.  You have trouble: ? Talking. ? Walking. ? Swallowing. ? Using your arms, hands, or legs.  You feel generally weak.  You are not thinking clearly, or you have trouble forming sentences. A friend or family member may notice this.  You have: ? Chest pain. ? Pain in your belly (abdomen). ? Shortness of breath. ? Sweating.  Your vision changes.  You are bleeding.  You have a very bad headache.  You have neck pain or a stiff neck.  You have a fever. These symptoms may be an emergency. Do not wait to see if the symptoms  will go away. Get medical help right away. Call your local emergency services (911 in the [place] Do not drive yourself to the hospital. Summary  Dizziness makes you feel unsteady or light-headed. You may feel like you are about to pass out (faint).  Drink enough fluid to keep your pee (urine) clear or pale yellow. Do not drink alcohol.  Avoid making quick movements if you feel dizzy.  Watch your dizziness for any changes. This information is not intended to replace advice given to you by your health care provider. Make sure you discuss any questions you have with your health care provider. Document Released: 05/04/2011 Document Revised: 06/01/2016 Document Reviewed: 06/01/2016 Elsevier Interactive Patient Education  2017 Elsevier Inc.      Edwina Barth, MD [facility]

## 2018-01-30 NOTE — Assessment & Plan Note (Signed)
Clinically stable.  Normal physical exam.  No red flag signs or symptoms.  Normal work-up today.  Not orthostatic.  Normal EKG, unremarkable CBC, normal urinalysis.  CMP pending.  No CVA findings.  Will monitor symptoms closely.  Advised to take meclizine as needed for symptom relief.  Follow-up in a few days if condition changes or worse.

## 2018-02-01 ENCOUNTER — Encounter: Payer: Self-pay | Admitting: *Deleted

## 2018-02-21 ENCOUNTER — Other Ambulatory Visit: Payer: Self-pay | Admitting: Podiatry

## 2018-03-14 ENCOUNTER — Other Ambulatory Visit: Payer: Self-pay | Admitting: Physician Assistant

## 2018-04-04 DIAGNOSIS — B079 Viral wart, unspecified: Secondary | ICD-10-CM | POA: Diagnosis not present

## 2018-04-04 DIAGNOSIS — D485 Neoplasm of uncertain behavior of skin: Secondary | ICD-10-CM | POA: Diagnosis not present

## 2018-06-03 ENCOUNTER — Other Ambulatory Visit: Payer: Self-pay | Admitting: Emergency Medicine

## 2018-06-03 NOTE — Telephone Encounter (Signed)
Requested medication (s) are due for refill today: yes  Requested medication (s) are on the active medication list: yes  Last refill:  03/14/18  Future visit scheduled: no  Notes to clinic:  Cannot delegate 50,000 units of Vitamin D2    Requested Prescriptions  Pending Prescriptions Disp Refills   Vitamin D, Ergocalciferol, (DRISDOL) 1.25 MG (50000 UT) CAPS capsule [Pharmacy Med Name: VITAMIN D2 1.25MG (50,000 UNIT)] 12 capsule 1    Sig: TAKE 1 CAPSULE (50,000 UNITS TOTAL) BY MOUTH EVERY 7 (SEVEN) DAYS.     There is no refill protocol information for this order

## 2018-06-26 ENCOUNTER — Other Ambulatory Visit: Payer: Self-pay | Admitting: Emergency Medicine

## 2018-07-31 ENCOUNTER — Ambulatory Visit: Payer: Federal, State, Local not specified - PPO | Admitting: Family Medicine

## 2018-07-31 ENCOUNTER — Other Ambulatory Visit: Payer: Self-pay

## 2018-07-31 ENCOUNTER — Encounter: Payer: Self-pay | Admitting: Family Medicine

## 2018-07-31 VITALS — BP 122/76 | HR 59 | Temp 98.2°F | Resp 14 | Ht 62.0 in | Wt 214.2 lb

## 2018-07-31 DIAGNOSIS — R7303 Prediabetes: Secondary | ICD-10-CM

## 2018-07-31 DIAGNOSIS — E7800 Pure hypercholesterolemia, unspecified: Secondary | ICD-10-CM | POA: Diagnosis not present

## 2018-07-31 DIAGNOSIS — Z13220 Encounter for screening for lipoid disorders: Secondary | ICD-10-CM | POA: Diagnosis not present

## 2018-07-31 DIAGNOSIS — E559 Vitamin D deficiency, unspecified: Secondary | ICD-10-CM

## 2018-07-31 NOTE — Patient Instructions (Addendum)
I will recheck the vitamin D level today.  I suspect you will be able to get by with 2000 units of vitamin D over-the-counter once per day.  Recheck in 6 weeks for a lab only visit on that 2000 unit dose, and if levels are decreasing we can restart the prescription strength.  I will recheck the cholesterol, but as we discussed it was not high enough risk to start medication at this time.  Keep up the good work with exercise and watching diet.  I will check the prediabetes test as well.  Thank you for coming in today.   If you have lab work done today you will be contacted with your lab results within the next 2 weeks.  If you have not heard from [place] then please contact us. The fastest way to get your results is to register for My Chart.   IF you received an x-ray today, you will receive an invoice from [facility]. Please contact [facility] at [phone] with questions or concerns regarding your invoice.   IF you received labwork today, you will receive an invoice from [place]. Please contact [facility] at [phone] with questions or concerns regarding your invoice.   Our billing staff will not be able to assist you with questions regarding bills from these companies.  You will be contacted with the lab results as soon as they are available. The fastest way to get your results is to activate your My Chart account. Instructions are located on the last page of this paperwork. If you have not heard from [place] regarding the results in 2 weeks, please contact this office.

## 2018-07-31 NOTE — Progress Notes (Signed)
Subjective:    Patient ID: Allison Wright, female    DOB: [birth year], 53 y.o.   MRN: [id number]  HPI Allison Wright is a 53 y.o. female Presents today for: Chief Complaint  Patient presents with  . low vitamin D    Need a refill on Vitamin D. Patient is not fasting for fasting blood work   History of low vitamin D. Ran out about a week or two ago. Started on 50k unit dose once per week since last July.  Had not been taking vitamin D prior to reading of 19.6 in July 2019.   Hx of vertigo - getting better.    Prediabetes: Lab Results  Component Value Date   HGBA1C 5.9 (H) 01/30/2018   Body mass index is 39.18 kg/m. Wt Readings from Last 3 Encounters:  07/31/18 214 lb 3.2 oz (97.2 kg)  01/30/18 201 lb 3.2 oz (91.3 kg)  12/31/17 200 lb 6.4 oz (90.9 kg)  exercising 5 days per week. Stairs.  Fast food - rare.  Sugar beverages:none.   Hyperlipidemia:  Lab Results  Component Value Date   CHOL 249 (H) 12/10/2017   HDL 81 12/10/2017   LDLCALC 153 (H) 12/10/2017   TRIG 75 12/10/2017   CHOLHDL 3.1 12/10/2017   Lab Results  Component Value Date   ALT 16 01/30/2018   AST 15 01/30/2018   ALKPHOS 86 01/30/2018   BILITOT <0.2 01/30/2018   Exercising. No meds.  The 10-year ASCVD risk score Denman George DC Montez Hageman., et al., 2013) is: 1.8%   Values used to calculate the score:     Age: 71 years     Sex: Female     Is Non-Hispanic African American: Yes     Diabetic: No     Tobacco smoker: No     Systolic Blood Pressure: 122 mmHg     Is BP treated: No     HDL Cholesterol: 81 mg/dL     Total Cholesterol: 249 mg/dL      Patient Active Problem List   Diagnosis Date Noted  . Dizziness 01/30/2018  . Stress fracture of left calcaneus 08/28/2017  . Prediabetes 10/12/2016  . Overweight 10/12/2016  . Systolic ejection murmur 10/09/2016   Past Medical History:  Diagnosis Date  . Allergy   . Heart murmur   . Post-menopause    Past Surgical History:  Procedure Laterality Date    . ABDOMINAL HYSTERECTOMY  2001   Retained cervix   No Known Allergies Prior to Admission medications   Medication Sig Start Date End Date Taking? Authorizing Provider  Vitamin D, Ergocalciferol, (DRISDOL) 1.25 MG (50000 UT) CAPS capsule TAKE 1 CAPSULE (50,000 UNITS TOTAL) BY MOUTH EVERY 7 (SEVEN) DAYS. 06/04/18  Yes Georgina Quint, MD   Social History   Socioeconomic History  . Marital status: Single    Spouse name: Not on file  . Number of children: Not on file  . Years of education: Not on file  . Highest education level: Not on file  Occupational History  . Not on file  Social Needs  . Financial resource strain: Not on file  . Food insecurity:    Worry: Not on file    Inability: Not on file  . Transportation needs:    Medical: Not on file    Non-medical: Not on file  Tobacco Use  . Smoking status: Former Smoker    Packs/day: 0.50    Years: 10.00    Pack years: 5.00  Last attempt to quit: 05/29/1996    Years since quitting: 22.1  . Smokeless tobacco: Never Used  Substance and Sexual Activity  . Alcohol use: No    Alcohol/week: 0.0 standard drinks  . Drug use: No  . Sexual activity: Never  Lifestyle  . Physical activity:    Days per week: Not on file    Minutes per session: Not on file  . Stress: Not on file  Relationships  . Social connections:    Talks on phone: Not on file    Gets together: Not on file    Attends religious service: Not on file    Active member of club or organization: Not on file    Attends meetings of clubs or organizations: Not on file    Relationship status: Not on file  . Intimate partner violence:    Fear of current or ex partner: Not on file    Emotionally abused: Not on file    Physically abused: Not on file    Forced sexual activity: Not on file  Other Topics Concern  . Not on file  Social History Narrative  . Not on file    Review of Systems  Constitutional: Negative for fatigue and unexpected weight change.   Respiratory: Negative for chest tightness and shortness of breath.   Cardiovascular: Negative for chest pain, palpitations and leg swelling.  Neurological: Positive for dizziness (min, improving. ).       Objective:   Physical Exam Vitals signs reviewed.  Constitutional:      Appearance: She is well-developed.  HENT:     Head: Normocephalic and atraumatic.  Eyes:     Conjunctiva/sclera: Conjunctivae normal.     Pupils: Pupils are equal, round, and reactive to light.  Neck:     Vascular: No carotid bruit.  Cardiovascular:     Rate and Rhythm: Normal rate and regular rhythm.     Heart sounds: Normal heart sounds.  Pulmonary:     Effort: Pulmonary effort is normal.     Breath sounds: Normal breath sounds.  Abdominal:     Palpations: Abdomen is soft. There is no pulsatile mass.     Tenderness: There is no abdominal tenderness.  Skin:    General: Skin is warm and dry.  Neurological:     Mental Status: She is alert and oriented to person, place, and time.  Psychiatric:        Behavior: Behavior normal.    Vitals:   07/31/18 1532  BP: 122/76  Pulse: (!) 59  Resp: 14  Temp: 98.2 F (36.8 C)  TempSrc: Oral  SpO2: 98%  Weight: 214 lb 3.2 oz (97.2 kg)  Height:  (1.575 m)      Assessment & Plan:  Allison Wright is a 53 y.o. female Vitamin D deficiency - Plan: Vitamin D, 25-hydroxy  -Check baseline level, but will try 2000 units over-the-counter daily at this time to see if that dose may be sufficient.  Recheck vitamin D level as a lab only visit in 6 weeks.  Screening for lipid disorders Elevated LDL cholesterol level - Plan: Lipid Panel, Comprehensive metabolic panel  -Repeat levels, but not previously at high enough ASCVD risk to recommend statin.  Diet/exercise for weight management.  Prediabetes - Plan: Hemoglobin A1c screening.  Lifestyle modification initial approach.   No orders of the defined types were placed in this encounter.  Patient Instructions    I will recheck the vitamin D level today.  I  suspect you will be able to get by with 2000 units of vitamin D over-the-counter once per day.  Recheck in 6 weeks for a lab only visit on that 2000 unit dose, and if levels are decreasing we can restart the prescription strength.  I will recheck the cholesterol, but as we discussed it was not high enough risk to start medication at this time.  Keep up the good work with exercise and watching diet.  I will check the prediabetes test as well.  Thank you for coming in today.   If you have lab work done today you will be contacted with your lab results within the next 2 weeks.  If you have not heard from [place] then please contact us. The fastest way to get your results is to register for My Chart.   IF you received an x-ray today, you will receive an invoice from [facility]. Please contact [facility] at [phone] with questions or concerns regarding your invoice.   IF you received labwork today, you will receive an invoice from [place]. Please contact [facility] at [phone] with questions or concerns regarding your invoice.   Our billing staff will not be able to assist you with questions regarding bills from these companies.  You will be contacted with the lab results as soon as they are available. The fastest way to get your results is to activate your My Chart account. Instructions are located on the last page of this paperwork. If you have not heard from [place] regarding the results in 2 weeks, please contact this office.         Signed,   Meredith Staggers, MD [facility] at [facility].  08/03/18 1:02 PM

## 2018-08-01 LAB — COMPREHENSIVE METABOLIC PANEL
ALT: 21 IU/L (ref 0–32)
AST: 19 IU/L (ref 0–40)
Albumin/Globulin Ratio: 1.4 (ref 1.2–2.2)
Albumin: 4.1 g/dL (ref 3.8–4.9)
Alkaline Phosphatase: 76 IU/L (ref 39–117)
BUN / CREAT RATIO: 11 (ref 9–23)
BUN: 9 mg/dL (ref 6–24)
Bilirubin Total: <0.2 mg/dL (ref 0.0–1.2)
CO2: 22 mmol/L (ref 20–29)
Calcium: 9.2 mg/dL (ref 8.7–10.2)
Chloride: 103 mmol/L (ref 96–106)
Creatinine, Ser: 0.83 mg/dL (ref 0.57–1.00)
GFR calc non Af Amer: 81 mL/min/{1.73_m2} (ref >59)
GFR, EST AFRICAN AMERICAN: 93 mL/min/{1.73_m2} (ref >59)
Globulin, Total: 3 g/dL (ref 1.5–4.5)
Glucose: 89 mg/dL (ref 65–99)
Potassium: 4.4 mmol/L (ref 3.5–5.2)
Sodium: 141 mmol/L (ref 134–144)
TOTAL PROTEIN: 7.1 g/dL (ref 6.0–8.5)

## 2018-08-01 LAB — LIPID PANEL
CHOL/HDL RATIO: 3.2 ratio (ref 0.0–4.4)
Cholesterol, Total: 239 mg/dL — ABNORMAL HIGH (ref 100–199)
HDL: 74 mg/dL (ref >39)
LDL Calculated: 143 mg/dL — ABNORMAL HIGH (ref 0–99)
TRIGLYCERIDES: 112 mg/dL (ref 0–149)
VLDL Cholesterol Cal: 22 mg/dL (ref 5–40)

## 2018-08-01 LAB — VITAMIN D 25 HYDROXY (VIT D DEFICIENCY, FRACTURES): VIT D 25 HYDROXY: 32.3 ng/mL (ref 30.0–100.0)

## 2018-08-01 LAB — HEMOGLOBIN A1C
Est. average glucose Bld gHb Est-mCnc: 120 mg/dL
Hgb A1c MFr Bld: 5.8 % — ABNORMAL HIGH (ref 4.8–5.6)

## 2018-08-02 ENCOUNTER — Other Ambulatory Visit: Payer: Self-pay | Admitting: Emergency Medicine

## 2018-08-03 ENCOUNTER — Encounter: Payer: Self-pay | Admitting: Family Medicine

## 2018-08-15 ENCOUNTER — Telehealth: Payer: Self-pay | Admitting: *Deleted

## 2018-08-15 NOTE — Telephone Encounter (Signed)
Faxed Rx request for Vitamin D2 1.25 mg to patient pharmacy, denied because patient needs office visit for more refills. Confirmation page received 5:50 pm.

## 2018-09-16 ENCOUNTER — Telehealth: Payer: Self-pay | Admitting: Family Medicine

## 2018-09-16 ENCOUNTER — Other Ambulatory Visit: Payer: Self-pay

## 2018-09-16 DIAGNOSIS — E7800 Pure hypercholesterolemia, unspecified: Secondary | ICD-10-CM

## 2018-09-16 DIAGNOSIS — E559 Vitamin D deficiency, unspecified: Secondary | ICD-10-CM

## 2018-09-16 DIAGNOSIS — R7303 Prediabetes: Secondary | ICD-10-CM

## 2018-09-16 NOTE — Telephone Encounter (Signed)
Copied from CRM [id number]. Topic: General - Other >> Sep 16, 2018  2:28 PM Percival Spanish wrote:  Pt saw Dr Chilton Si on 07/31/2018 and said he told her to follow up with blood work in about 3 weeks , there is no order

## 2018-09-16 NOTE — Telephone Encounter (Signed)
Spoke with pt about labs and scheduled her for a labs only for 09/23/2018

## 2018-09-23 ENCOUNTER — Ambulatory Visit (INDEPENDENT_AMBULATORY_CARE_PROVIDER_SITE_OTHER): Payer: Federal, State, Local not specified - PPO | Admitting: Family Medicine

## 2018-09-23 ENCOUNTER — Other Ambulatory Visit: Payer: Self-pay

## 2018-09-23 DIAGNOSIS — E559 Vitamin D deficiency, unspecified: Secondary | ICD-10-CM

## 2018-09-23 DIAGNOSIS — R7303 Prediabetes: Secondary | ICD-10-CM | POA: Diagnosis not present

## 2018-09-23 DIAGNOSIS — E7800 Pure hypercholesterolemia, unspecified: Secondary | ICD-10-CM

## 2018-09-24 LAB — LIPID PANEL
Chol/HDL Ratio: 3.7 ratio (ref 0.0–4.4)
Cholesterol, Total: 256 mg/dL — ABNORMAL HIGH (ref 100–199)
HDL: 69 mg/dL (ref >39)
LDL Calculated: 171 mg/dL — ABNORMAL HIGH (ref 0–99)
Triglycerides: 81 mg/dL (ref 0–149)
VLDL Cholesterol Cal: 16 mg/dL (ref 5–40)

## 2018-09-24 LAB — HEMOGLOBIN A1C
Est. average glucose Bld gHb Est-mCnc: 120 mg/dL
Hgb A1c MFr Bld: 5.8 % — ABNORMAL HIGH (ref 4.8–5.6)

## 2018-09-24 LAB — VITAMIN D 25 HYDROXY (VIT D DEFICIENCY, FRACTURES): Vit D, 25-Hydroxy: 29 ng/mL — ABNORMAL LOW (ref 30.0–100.0)

## 2018-11-25 ENCOUNTER — Encounter: Payer: Self-pay | Admitting: Family Medicine

## 2018-11-25 ENCOUNTER — Other Ambulatory Visit: Payer: Self-pay

## 2018-11-25 ENCOUNTER — Ambulatory Visit: Payer: Federal, State, Local not specified - PPO | Admitting: Family Medicine

## 2018-11-25 VITALS — BP 116/70 | HR 63 | Temp 98.5°F | Resp 16 | Ht 62.0 in | Wt 213.0 lb

## 2018-11-25 DIAGNOSIS — M25562 Pain in left knee: Secondary | ICD-10-CM

## 2018-11-25 MED ORDER — MELOXICAM 7.5 MG PO TABS: 7.5000 mg | ORAL_TABLET | Freq: Every day | ORAL | 0 refills | Status: DC

## 2018-11-25 NOTE — Progress Notes (Signed)
Subjective:    Patient ID: Allison Wright, female    DOB: [birth year], 53 y.o.   MRN: [id number]  HPI Allison Wright is a 53 y.o. female Presents today for: Chief Complaint  Patient presents with  . Knee Pain    left/ 1 wk.   L knee pain Past 1 week. Started out of the blue. Noticed with walking. No known injury.  No change in activity recently.  No swelling. No giving way, no locking. Feels like may want to give at times.  Up and down stairs is sore.  Sore after standing from prolonged seated position.  No prior L knee issues, some R knee pain in past with OA.   Tx: ibuprofen otc (600-800mg  once per day for 3-4 days).  Improved with this and icing. otc copper fit brace for 2 days. , tiger balm.   Feels like getting a little better.   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   Stress fx of left calcaneus tx with boot for 2 months in mid 2019. No current heel issues.   Tricompartmental degenerative changes of the right knee on previous x-ray, no recent left knee imaging.  Patient Active Problem List   Diagnosis Date Noted  . Dizziness 01/30/2018  . Stress fracture of left calcaneus 08/28/2017  . Prediabetes 10/12/2016  . Overweight 10/12/2016  . Systolic ejection murmur 10/09/2016   Past Medical History:  Diagnosis Date  . Allergy   . Heart murmur   . Post-menopause    Past Surgical History:  Procedure Laterality Date  . ABDOMINAL HYSTERECTOMY  2001   Retained cervix   No Known Allergies Prior to Admission medications   Medication Sig Start Date End Date Taking? Authorizing Provider  Vitamin D, Ergocalciferol, (DRISDOL) 1.25 MG (50000 UT) CAPS capsule TAKE 1 CAPSULE (50,000 UNITS TOTAL) BY MOUTH EVERY 7 (SEVEN) DAYS. 06/04/18  Yes Georgina QuintSagardia, Miguel Jose, MD   Social History   Socioeconomic History  . Marital status: Single    Spouse name: Not on file  . Number of children: Not on file  . Years of education: Not on file   . Highest education level: Not on file  Occupational History  . Not on file  Social Needs  . Financial resource strain: Not on file  . Food insecurity    Worry: Not on file    Inability: Not on file  . Transportation needs    Medical: Not on file    Non-medical: Not on file  Tobacco Use  . Smoking status: Former Smoker    Packs/day: 0.50    Years: 10.00    Pack years: 5.00    Quit date: 05/29/1996    Years since quitting: 22.5  . Smokeless tobacco: Never Used  Substance and Sexual Activity  . Alcohol use: No    Alcohol/week: 0.0 standard drinks  . Drug use: No  . Sexual activity: Never  Lifestyle  . Physical activity    Days per week: Not on file    Minutes per session: Not on file  . Stress: Not on file  Relationships  . Social Musicianconnections    Talks on phone: Not on file    Gets together: Not on file    Attends religious service: Not on file    Active member of club or organization: Not on file    Attends meetings of clubs or organizations: Not on file    Relationship status: Not on file  .  Intimate partner violence    Fear of current or ex partner: Not on file    Emotionally abused: Not on file    Physically abused: Not on file    Forced sexual activity: Not on file  Other Topics Concern  . Not on file  Social History Narrative  . Not on file    Review of Systems  Constitutional: Negative for chills, diaphoresis, fever and unexpected weight change.  Musculoskeletal: Positive for arthralgias. Negative for joint swelling.  Skin: Negative for rash.  Neurological: Negative for weakness.       Objective:   Physical Exam Constitutional:      General: She is not in acute distress.    Appearance: She is well-developed.  HENT:     Head: Normocephalic and atraumatic.  Cardiovascular:     Rate and Rhythm: Normal rate.  Pulmonary:     Effort: Pulmonary effort is normal.  Musculoskeletal:     Left knee: She exhibits decreased range of motion (80 flex, full ext.  ), swelling (trace ) and bony tenderness. She exhibits no LCL laxity and no MCL laxity. Tenderness found. Medial joint line and lateral joint line tenderness noted. No MCL and no LCL tenderness noted.  Neurological:     Mental Status: She is alert and oriented to person, place, and time.     Vitals:   11/25/18 1034  BP: 116/70  Pulse: 63  Resp: 16  Temp: 98.5 F (36.9 C)  TempSrc: Oral  SpO2: 95%  Weight: 213 lb (96.6 kg)  Height: 5\' 2"  (1.575 m)         Assessment & Plan:    Allison Wright is a 53 y.o. female Acute pain of left knee - Plan: Apply knee sleeve, meloxicam (MOBIC) 7.5 MG tablet  -Suspected flare of degenerative joint disease.  Has not had recent imaging of this knee, but other knee with tricompartmental degenerative changes.   - Slight improvement.  Decided against imaging or injection at this time, trial of meloxicam short-term with potential side effects and risks discussed, knee brace given and recheck in next 2 weeks if not improving, sooner if worse  Meds ordered this encounter  Medications  . meloxicam (MOBIC) 7.5 MG tablet    Sig: Take 1 tablet (7.5 mg total) by mouth daily.    Dispense:  20 tablet    Refill:  0   Patient Instructions    Try new knee brace, see other info on knee pain below.  I suspect you probably have a flare of arthritis of that knee.  Can try Tylenol over-the-counter, but meloxicam was written to take up to once per day for the next few weeks.  Do not take Advil, Aleve or other NSAIDs while you are taking that medication.  Follow-up in 2 weeks if not improving, sooner if worse.   Acute Knee Pain, Adult Acute knee pain is sudden and may be caused by damage, swelling, or irritation of the muscles and tissues that support your knee. The injury may result from:  A fall.  An injury to your knee from twisting motions.  A hit to the knee.  Infection. Acute knee pain may go away on its own with time and rest. If it does not, your  health care provider may order tests to find the cause of the pain. These may include:  Imaging tests, such as an X-ray, MRI, or ultrasound.  Joint aspiration. In this test, fluid is removed from the knee.  Arthroscopy. In this test, a lighted tube is inserted into the knee and an image is projected onto a TV screen.  Biopsy. In this test, a sample of tissue is removed from the body and studied under a microscope. Follow these instructions at home: Pay attention to any changes in your symptoms. Take these actions to relieve your pain. If you have a knee sleeve or brace:   Wear the sleeve or brace as told by your health care provider. Remove it only as told by your health care provider.  Loosen the sleeve or brace if your toes tingle, become numb, or turn cold and blue.  Keep the sleeve or brace clean.  If the sleeve or brace is not waterproof: ? Do not let it get wet. ? Cover it with a watertight covering when you take a bath or shower. Activity  Rest your knee.  Do not do things that cause pain or make pain worse.  Avoid high-impact activities or exercises, such as running, jumping rope, or doing jumping jacks.  Work with a physical therapist to make a safe exercise program, as recommended by your health care provider. Do exercises as told by your physical therapist. Managing pain, stiffness, and swelling   If directed, put ice on the knee: ? Put ice in a plastic bag. ? Place a towel between your skin and the bag. ? Leave the ice on for 20 minutes, 2-3 times a day.  If directed, use an elastic bandage to put pressure (compression) on your injured knee. This may control swelling, give support, and help with discomfort. General instructions  Take over-the-counter and prescription medicines only as told by your health care provider.  Raise (elevate) your knee above the level of your heart when you are sitting or lying down.  Sleep with a pillow under your knee.  Do not  use any products that contain nicotine or tobacco, such as cigarettes, e-cigarettes, and chewing tobacco. These can delay healing. If you need help quitting, ask your health care provider.  If you are overweight, work with your health care provider and a dietitian to set a weight-loss goal that is healthy and reasonable for you. Extra weight can put pressure on your knee.  Keep all follow-up visits as told by your health care provider. This is important. Contact a health care provider if:  Your knee pain continues, changes, or gets worse.  You have a fever along with knee pain.  Your knee feels warm to the touch.  Your knee buckles or locks up. Get help right away if:  Your knee swells, and the swelling becomes worse.  You cannot move your knee.  You have severe pain in your knee. Summary  Acute knee pain can be caused by a fall, an injury, an infection, or damage, swelling, or irritation of the tissues that support your knee.  Your health care provider may perform tests to find out the cause of the pain.  Pay attention to any changes in your symptoms. Relieve your pain with rest, medicines, light activity, and use of ice.  Get help if your pain continues or becomes worse, your knee swells, or you cannot move your knee. This information is not intended to replace advice given to you by your health care provider. Make sure you discuss any questions you have with your health care provider. Document Released: 03/12/2007 Document Revised: 10/25/2017 Document Reviewed: 10/25/2017 Elsevier Patient Education  [employer].   If you have lab work done  today you will be contacted with your lab results within the next 2 weeks.  If you have not heard from us then please contact us. The fastest way to get your results is to register for My Chart.   IF you received an x-ray today, you will receive an invoice from [facility]. Please contact [facility] at [phone]  with questions or concerns regarding your invoice.   IF you received labwork today, you will receive an invoice from [facility]. Please contact [facility] at [phone] with questions or concerns regarding your invoice.   Our billing staff will not be able to assist you with questions regarding bills from these companies.  You will be contacted with the lab results as soon as they are available. The fastest way to get your results is to activate your My Chart account. Instructions are located on the last page of this paperwork. If you have not heard from us regarding the results in 2 weeks, please contact this office.        Signed,   Meredith StaggersJeffrey Beyza Bellino, MD [facility].  11/25/18 11:52 AM

## 2018-11-25 NOTE — Patient Instructions (Addendum)
Try new knee brace, see other info on knee pain below.  I suspect you probably have a flare of arthritis of that knee.  Can try Tylenol over-the-counter, but meloxicam was written to take up to once per day for the next few weeks.  Do not take Advil, Aleve or other NSAIDs while you are taking that medication.  Follow-up in 2 weeks if not improving, sooner if worse.   Acute Knee Pain, Adult Acute knee pain is sudden and may be caused by damage, swelling, or irritation of the muscles and tissues that support your knee. The injury may result from:  A fall.  An injury to your knee from twisting motions.  A hit to the knee.  Infection. Acute knee pain may go away on its own with time and rest. If it does not, your health care provider may order tests to find the cause of the pain. These may include:  Imaging tests, such as an X-ray, MRI, or ultrasound.  Joint aspiration. In this test, fluid is removed from the knee.  Arthroscopy. In this test, a lighted tube is inserted into the knee and an image is projected onto a TV screen.  Biopsy. In this test, a sample of tissue is removed from the body and studied under a microscope. Follow these instructions at home: Pay attention to any changes in your symptoms. Take these actions to relieve your pain. If you have a knee sleeve or brace:   Wear the sleeve or brace as told by your health care provider. Remove it only as told by your health care provider.  Loosen the sleeve or brace if your toes tingle, become numb, or turn cold and blue.  Keep the sleeve or brace clean.  If the sleeve or brace is not waterproof: ? Do not let it get wet. ? Cover it with a watertight covering when you take a bath or shower. Activity  Rest your knee.  Do not do things that cause pain or make pain worse.  Avoid high-impact activities or exercises, such as running, jumping rope, or doing jumping jacks.  Work with a physical therapist to make a safe  exercise program, as recommended by your health care provider. Do exercises as told by your physical therapist. Managing pain, stiffness, and swelling   If directed, put ice on the knee: ? Put ice in a plastic bag. ? Place a towel between your skin and the bag. ? Leave the ice on for 20 minutes, 2-3 times a day.  If directed, use an elastic bandage to put pressure (compression) on your injured knee. This may control swelling, give support, and help with discomfort. General instructions  Take over-the-counter and prescription medicines only as told by your health care provider.  Raise (elevate) your knee above the level of your heart when you are sitting or lying down.  Sleep with a pillow under your knee.  Do not use any products that contain nicotine or tobacco, such as cigarettes, e-cigarettes, and chewing tobacco. These can delay healing. If you need help quitting, ask your health care provider.  If you are overweight, work with your health care provider and a dietitian to set a weight-loss goal that is healthy and reasonable for you. Extra weight can put pressure on your knee.  Keep all follow-up visits as told by your health care provider. This is important. Contact a health care provider if:  Your knee pain continues, changes, or gets worse.  You have a fever along  with knee pain.  Your knee feels warm to the touch.  Your knee buckles or locks up. Get help right away if:  Your knee swells, and the swelling becomes worse.  You cannot move your knee.  You have severe pain in your knee. Summary  Acute knee pain can be caused by a fall, an injury, an infection, or damage, swelling, or irritation of the tissues that support your knee.  Your health care provider may perform tests to find out the cause of the pain.  Pay attention to any changes in your symptoms. Relieve your pain with rest, medicines, light activity, and use of ice.  Get help if your pain continues or  becomes worse, your knee swells, or you cannot move your knee. This information is not intended to replace advice given to you by your health care provider. Make sure you discuss any questions you have with your health care provider. Document Released: 03/12/2007 Document Revised: 10/25/2017 Document Reviewed: 10/25/2017 Elsevier Patient Education  [employer]2020 Else[employer] Inc.   If you have lab work done today you will be contacted with your lab results within the next 2 weeks.  If you have not heard from us then please contact us. The fastest way to get your results is to register for My Chart.   IF you received an x-ray today, you will receive an invoice from [facility]. Please contact [facility] at [phone] with questions or concerns regarding your invoice.   IF you received labwork today, you will receive an invoice from [facility]. Please contact [facility] at [phone] with questions or concerns regarding your invoice.   Our billing staff will not be able to assist you with questions regarding bills from these companies.  You will be contacted with the lab results as soon as they are available. The fastest way to get your results is to activate your My Chart account. Instructions are located on the last page of this paperwork. If you have not heard from us regarding the results in 2 weeks, please contact this office.

## 2018-12-11 ENCOUNTER — Ambulatory Visit: Payer: Federal, State, Local not specified - PPO | Admitting: Family Medicine

## 2019-01-16 DIAGNOSIS — Z1231 Encounter for screening mammogram for malignant neoplasm of breast: Secondary | ICD-10-CM | POA: Diagnosis not present

## 2019-01-16 DIAGNOSIS — Z1272 Encounter for screening for malignant neoplasm of vagina: Secondary | ICD-10-CM | POA: Diagnosis not present

## 2019-01-16 DIAGNOSIS — Z6836 Body mass index (BMI) 36.0-36.9, adult: Secondary | ICD-10-CM | POA: Diagnosis not present

## 2019-01-16 DIAGNOSIS — Z01419 Encounter for gynecological examination (general) (routine) without abnormal findings: Secondary | ICD-10-CM | POA: Diagnosis not present

## 2019-01-16 LAB — HM PAP SMEAR: HM Pap smear: NEGATIVE

## 2019-02-05 DIAGNOSIS — R351 Nocturia: Secondary | ICD-10-CM | POA: Diagnosis not present

## 2019-02-05 DIAGNOSIS — R350 Frequency of micturition: Secondary | ICD-10-CM | POA: Diagnosis not present

## 2019-02-05 DIAGNOSIS — N3941 Urge incontinence: Secondary | ICD-10-CM | POA: Diagnosis not present

## 2019-03-13 DIAGNOSIS — R351 Nocturia: Secondary | ICD-10-CM | POA: Diagnosis not present

## 2019-03-13 DIAGNOSIS — N3941 Urge incontinence: Secondary | ICD-10-CM | POA: Diagnosis not present

## 2019-04-07 ENCOUNTER — Other Ambulatory Visit: Payer: Self-pay | Admitting: Obstetrics and Gynecology

## 2019-04-07 DIAGNOSIS — N644 Mastodynia: Secondary | ICD-10-CM

## 2019-04-17 ENCOUNTER — Ambulatory Visit
Admission: RE | Admit: 2019-04-17 | Discharge: 2019-04-17 | Disposition: A | Discharge status: Home / Self Care | Payer: Federal, State, Local not specified - PPO | Source: Ambulatory Visit | Attending: Obstetrics and Gynecology | Admitting: Obstetrics and Gynecology

## 2019-04-17 ENCOUNTER — Other Ambulatory Visit: Payer: Self-pay | Admitting: Obstetrics and Gynecology

## 2019-04-17 ENCOUNTER — Other Ambulatory Visit: Payer: Self-pay

## 2019-04-17 DIAGNOSIS — N644 Mastodynia: Secondary | ICD-10-CM | POA: Diagnosis not present

## 2019-04-17 DIAGNOSIS — R928 Other abnormal and inconclusive findings on diagnostic imaging of breast: Secondary | ICD-10-CM | POA: Diagnosis not present

## 2019-05-08 DIAGNOSIS — R351 Nocturia: Secondary | ICD-10-CM | POA: Diagnosis not present

## 2019-05-08 DIAGNOSIS — N3941 Urge incontinence: Secondary | ICD-10-CM | POA: Diagnosis not present

## 2019-09-03 DIAGNOSIS — R351 Nocturia: Secondary | ICD-10-CM | POA: Diagnosis not present

## 2019-09-03 DIAGNOSIS — N3941 Urge incontinence: Secondary | ICD-10-CM | POA: Diagnosis not present

## 2019-10-15 ENCOUNTER — Other Ambulatory Visit: Payer: Self-pay

## 2019-10-15 ENCOUNTER — Encounter: Payer: Self-pay | Admitting: Registered Nurse

## 2019-10-15 ENCOUNTER — Ambulatory Visit: Payer: Federal, State, Local not specified - PPO | Admitting: Registered Nurse

## 2019-10-15 VITALS — BP 100/65 | HR 57 | Temp 97.7°F | Resp 17 | Ht 64.0 in | Wt 217.2 lb

## 2019-10-15 DIAGNOSIS — Z13228 Encounter for screening for other metabolic disorders: Secondary | ICD-10-CM

## 2019-10-15 DIAGNOSIS — E559 Vitamin D deficiency, unspecified: Secondary | ICD-10-CM | POA: Diagnosis not present

## 2019-10-15 DIAGNOSIS — Z13220 Encounter for screening for lipoid disorders: Secondary | ICD-10-CM

## 2019-10-15 DIAGNOSIS — Z1329 Encounter for screening for other suspected endocrine disorder: Secondary | ICD-10-CM | POA: Diagnosis not present

## 2019-10-15 DIAGNOSIS — M25562 Pain in left knee: Secondary | ICD-10-CM | POA: Diagnosis not present

## 2019-10-15 DIAGNOSIS — J302 Other seasonal allergic rhinitis: Secondary | ICD-10-CM

## 2019-10-15 DIAGNOSIS — Z130 Encounter for screening for diseases of the blood and blood-forming organs and certain disorders involving the immune mechanism: Secondary | ICD-10-CM | POA: Diagnosis not present

## 2019-10-15 DIAGNOSIS — Z87898 Personal history of other specified conditions: Secondary | ICD-10-CM

## 2019-10-15 DIAGNOSIS — H9201 Otalgia, right ear: Secondary | ICD-10-CM

## 2019-10-15 MED ORDER — HYDROCORTISONE-ACETIC ACID 1-2 % OT SOLN: 4.0000 [drp] | Freq: Three times a day (TID) | OTIC | 0 refills | Status: DC

## 2019-10-15 MED ORDER — MECLIZINE HCL 12.5 MG PO TABS: 12.5000 mg | ORAL_TABLET | Freq: Three times a day (TID) | ORAL | 0 refills | Status: DC | PRN

## 2019-10-15 MED ORDER — FLUTICASONE PROPIONATE 50 MCG/ACT NA SUSP: 2.0000 | Freq: Every day | NASAL | 6 refills | Status: DC

## 2019-10-15 MED ORDER — MELOXICAM 7.5 MG PO TABS: 7.5000 mg | ORAL_TABLET | Freq: Every day | ORAL | 0 refills | Status: DC

## 2019-10-15 NOTE — Progress Notes (Signed)
Acute Office Visit  Subjective:    Patient ID: Allison Wright, female    DOB: 22-Apr-1966, 54 y.o.   MRN: [id number]  Chief Complaint  Patient presents with  . Ear Pain    patient states she has had an sharp pain in the right ear since monday that seems to comes and goes. Per patient she has tried some OTC ear drops helps a little but the pain just keeps coming   . Medication Refill    Both medications Pended     HPI Patient is in today for intermittent right ear pain Sharp in nature On and off since Monday No clear precipitating factor  OTC ear drops are mildly helpful  No pain today, feels like it may have resolved, no recent URI or abx use, no lymphadenopathy.   Hx of vertigo   Past Medical History:  Diagnosis Date  . Allergy   . Heart murmur   . Post-menopause     Past Surgical History:  Procedure Laterality Date  . ABDOMINAL HYSTERECTOMY  2001   Retained cervix    Family History  Problem Relation Age of Onset  . Cirrhosis Mother   . Alcohol abuse Mother   . Diabetes Father 88  . Heart attack Father   . Heart disease Paternal Grandmother     Social History   Socioeconomic History  . Marital status: Single    Spouse name: Not on file  . Number of children: Not on file  . Years of education: Not on file  . Highest education level: Not on file  Occupational History  . Not on file  Tobacco Use  . Smoking status: Former Smoker    Packs/day: 0.50    Years: 10.00    Pack years: 5.00    Quit date: 05/29/1996    Years since quitting: 23.3  . Smokeless tobacco: Never Used  Substance and Sexual Activity  . Alcohol use: No    Alcohol/week: 0.0 standard drinks  . Drug use: No  . Sexual activity: Never  Other Topics Concern  . Not on file  Social History Narrative  . Not on file   Social Determinants of Health   Financial Resource Strain:   . Difficulty of Paying Living Expenses:   Food Insecurity:   . Worried About Charity fundraiser in the Last  Year:   . Arboriculturist in the Last Year:   Transportation Needs:   . Film/video editor (Medical):   Marland Kitchen Lack of Transportation (Non-Medical):   Physical Activity:   . Days of Exercise per Week:   . Minutes of Exercise per Session:   Stress:   . Feeling of Stress :   Social Connections:   . Frequency of Communication with Friends and Family:   . Frequency of Social Gatherings with Friends and Family:   . Attends Religious Services:   . Active Member of Clubs or Organizations:   . Attends Archivist Meetings:   Marland Kitchen Marital Status:   Intimate Partner Violence:   . Fear of Current or Ex-Partner:   . Emotionally Abused:   Marland Kitchen Physically Abused:   . Sexually Abused:     Outpatient Medications Prior to Visit  Medication Sig Dispense Refill  . Vitamin D, Ergocalciferol, (DRISDOL) 1.25 MG (50000 UT) CAPS capsule TAKE 1 CAPSULE (50,000 UNITS TOTAL) BY MOUTH EVERY 7 (SEVEN) DAYS. 4 capsule 0  . meloxicam (MOBIC) 7.5 MG tablet Take 1 tablet (7.5 mg total)  by mouth daily. 20 tablet 0   No facility-administered medications prior to visit.    Allergies  Allergen Reactions  . Other     Seasonal    Review of Systems  Constitutional: Negative.   HENT: Positive for ear pain. Negative for congestion, dental problem, drooling, ear discharge, facial swelling, hearing loss, mouth sores, nosebleeds, postnasal drip, rhinorrhea, sinus pressure, sinus pain, sneezing, sore throat, tinnitus, trouble swallowing and voice change.   Eyes: Negative.   Respiratory: Negative.   Cardiovascular: Negative.   Gastrointestinal: Negative.   Endocrine: Negative.   Genitourinary: Negative.   Musculoskeletal: Negative.   Skin: Negative.   Allergic/Immunologic: Negative.   Neurological: Negative.   Hematological: Negative.   Psychiatric/Behavioral: Negative.   All other systems reviewed and are negative.      Objective:    Physical Exam Vitals and nursing note reviewed.  Constitutional:        General: She is not in acute distress.    Appearance: Normal appearance. She is normal weight. She is not ill-appearing, toxic-appearing or diaphoretic.  HENT:     Right Ear: Tympanic membrane, ear canal and external ear normal. There is no impacted cerumen.     Left Ear: Tympanic membrane, ear canal and external ear normal. There is no impacted cerumen.  Cardiovascular:     Rate and Rhythm: Normal rate and regular rhythm.  Skin:    Capillary Refill: Capillary refill takes less than 2 seconds.  Neurological:     General: No focal deficit present.     Mental Status: She is alert and oriented to person, place, and time. Mental status is at baseline.     Cranial Nerves: No cranial nerve deficit.  Psychiatric:        Mood and Affect: Mood normal.        Behavior: Behavior normal.        Thought Content: Thought content normal.        Judgment: Judgment normal.     BP 100/65   Pulse (!) 57   Temp 97.7 F (36.5 C) (Temporal)   Resp 17   Ht 5\' 4"  (1.626 m)   Wt 217 lb 3.2 oz (98.5 kg)   SpO2 98%   BMI 37.28 kg/m  Wt Readings from Last 3 Encounters:  10/15/19 217 lb 3.2 oz (98.5 kg)  11/25/18 213 lb (96.6 kg)  07/31/18 214 lb 3.2 oz (97.2 kg)    There are no preventive care reminders to display for this patient.  There are no preventive care reminders to display for this patient.   Lab Results  Component Value Date   TSH 2.510 10/09/2016   Lab Results  Component Value Date   WBC 8.6 01/30/2018   HGB 11.7 (A) 01/30/2018   HCT 35.8 (A) 01/30/2018   MCV 85.7 01/30/2018   PLT 330 10/09/2016   Lab Results  Component Value Date   NA 141 07/31/2018   K 4.4 07/31/2018   CO2 22 07/31/2018   GLUCOSE 89 07/31/2018   BUN 9 07/31/2018   CREATININE 0.83 07/31/2018   BILITOT <0.2 07/31/2018   ALKPHOS 76 07/31/2018   AST 19 07/31/2018   ALT 21 07/31/2018   PROT 7.1 07/31/2018   ALBUMIN 4.1 07/31/2018   CALCIUM 9.2 07/31/2018   Lab Results  Component Value Date    CHOL 256 (H) 09/23/2018   Lab Results  Component Value Date   HDL 69 09/23/2018   Lab Results  Component Value Date  LDLCALC 171 (H) 09/23/2018   Lab Results  Component Value Date   TRIG 81 09/23/2018   Lab Results  Component Value Date   CHOLHDL 3.7 09/23/2018   Lab Results  Component Value Date   HGBA1C 5.8 (H) 09/23/2018       Assessment & Plan:   Problem List Items Addressed This Visit    None    Visit Diagnoses    Acute pain of left knee    -  Primary   Relevant Medications   meloxicam (MOBIC) 7.5 MG tablet   Screening for endocrine, metabolic and immunity disorder       Relevant Orders   CBC   TSH   Hemoglobin A1c   Comprehensive metabolic panel   Lipid screening       Relevant Orders   Lipid Panel   Vitamin D deficiency       Relevant Orders   Vitamin D, 25-hydroxy   History of vertigo       Relevant Medications   meclizine (ANTIVERT) 12.5 MG tablet   Seasonal allergies       Relevant Medications   fluticasone (FLONASE) 50 MCG/ACT nasal spray   Ear pain, right       Relevant Medications   acetic acid-hydrocortisone (VOSOL-HC) OTIC solution       Meds ordered this encounter  Medications  . meloxicam (MOBIC) 7.5 MG tablet    Sig: Take 1 tablet (7.5 mg total) by mouth daily.    Dispense:  20 tablet    Refill:  0  . acetic acid-hydrocortisone (VOSOL-HC) OTIC solution    Sig: Place 4 drops into the right ear 3 (three) times daily.    Dispense:  10 mL    Refill:  0    Order Specific Question:   Supervising Provider    Answer:   Collie Siad A [id number]  . fluticasone (FLONASE) 50 MCG/ACT nasal spray    Sig: Place 2 sprays into both nostrils daily.    Dispense:  16 g    Refill:  6    Order Specific Question:   Supervising Provider    Answer:   Collie Siad A [id number]  . meclizine (ANTIVERT) 12.5 MG tablet    Sig: Take 1 tablet (12.5 mg total) by mouth 3 (three) times daily as needed for dizziness.    Dispense:  30 tablet    Refill:   0    Order Specific Question:   Supervising Provider    Answer:   Doristine Bosworth [id number]   PLAN  Vosol for otitis externa  Fluticasone for seasonal allergies  Meclizine po tid for vertigo symptoms  Refill meloxicam for daily prn for knee pain  Return precautions reviewed  Patient encouraged to call clinic with any questions, comments, or concerns.   Janeece Agee, NP

## 2019-10-15 NOTE — Patient Instructions (Signed)
° ° ° °  If you have lab work done today you will be contacted with your lab results within the next 2 weeks.  If you have not heard from us then please contact us. The fastest way to get your results is to register for My Chart. ° ° °IF you received an x-ray today, you will receive an invoice from [facility]. Please contact  Radiology at [phone] with questions or concerns regarding your invoice.  ° °IF you received labwork today, you will receive an invoice from [facility]. Please contact [facility] at [phone] with questions or concerns regarding your invoice.  ° °Our billing staff will not be able to assist you with questions regarding bills from these companies. ° °You will be contacted with the lab results as soon as they are available. The fastest way to get your results is to activate your My Chart account. Instructions are located on the last page of this paperwork. If you have not heard from us regarding the results in 2 weeks, please contact this office. °  ° ° ° °

## 2019-10-16 LAB — COMPREHENSIVE METABOLIC PANEL
ALT: 21 IU/L (ref 0–32)
AST: 22 IU/L (ref 0–40)
Albumin/Globulin Ratio: 1.4 (ref 1.2–2.2)
Albumin: 4.1 g/dL (ref 3.8–4.9)
Alkaline Phosphatase: 86 IU/L (ref 48–121)
BUN/Creatinine Ratio: 12 (ref 9–23)
BUN: 9 mg/dL (ref 6–24)
Bilirubin Total: 0.2 mg/dL (ref 0.0–1.2)
CO2: 24 mmol/L (ref 20–29)
Calcium: 9.5 mg/dL (ref 8.7–10.2)
Chloride: 101 mmol/L (ref 96–106)
Creatinine, Ser: 0.77 mg/dL (ref 0.57–1.00)
GFR calc Af Amer: 101 mL/min/{1.73_m2} (ref >59)
GFR calc non Af Amer: 87 mL/min/{1.73_m2} (ref >59)
Globulin, Total: 3 g/dL (ref 1.5–4.5)
Glucose: 105 mg/dL — ABNORMAL HIGH (ref 65–99)
Potassium: 4.5 mmol/L (ref 3.5–5.2)
Sodium: 141 mmol/L (ref 134–144)
Total Protein: 7.1 g/dL (ref 6.0–8.5)

## 2019-10-16 LAB — CBC
Hematocrit: 35.2 % (ref 34.0–46.6)
Hemoglobin: 11.6 g/dL (ref 11.1–15.9)
MCH: 28.6 pg (ref 26.6–33.0)
MCHC: 33 g/dL (ref 31.5–35.7)
MCV: 87 fL (ref 79–97)
Platelets: 330 10*3/uL (ref 150–450)
RBC: 4.05 x10E6/uL (ref 3.77–5.28)
RDW: 14.1 % (ref 11.7–15.4)
WBC: 7.7 10*3/uL (ref 3.4–10.8)

## 2019-10-16 LAB — LIPID PANEL
Chol/HDL Ratio: 3.8 ratio (ref 0.0–4.4)
Cholesterol, Total: 254 mg/dL — ABNORMAL HIGH (ref 100–199)
HDL: 67 mg/dL (ref >39)
LDL Chol Calc (NIH): 171 mg/dL — ABNORMAL HIGH (ref 0–99)
Triglycerides: 91 mg/dL (ref 0–149)
VLDL Cholesterol Cal: 16 mg/dL (ref 5–40)

## 2019-10-16 LAB — VITAMIN D 25 HYDROXY (VIT D DEFICIENCY, FRACTURES): Vit D, 25-Hydroxy: 35.3 ng/mL (ref 30.0–100.0)

## 2019-10-16 LAB — HEMOGLOBIN A1C
Est. average glucose Bld gHb Est-mCnc: 126 mg/dL
Hgb A1c MFr Bld: 6 % — ABNORMAL HIGH (ref 4.8–5.6)

## 2019-10-16 LAB — TSH: TSH: 2.08 u[IU]/mL (ref 0.450–4.500)

## 2019-11-26 ENCOUNTER — Other Ambulatory Visit: Payer: Self-pay

## 2019-11-26 ENCOUNTER — Ambulatory Visit (INDEPENDENT_AMBULATORY_CARE_PROVIDER_SITE_OTHER): Payer: Federal, State, Local not specified - PPO

## 2019-11-26 ENCOUNTER — Encounter: Payer: Self-pay | Admitting: Family Medicine

## 2019-11-26 ENCOUNTER — Ambulatory Visit: Payer: Federal, State, Local not specified - PPO | Admitting: Family Medicine

## 2019-11-26 VITALS — BP 108/66 | HR 75 | Temp 98.4°F | Ht 64.0 in | Wt 212.0 lb

## 2019-11-26 DIAGNOSIS — M25562 Pain in left knee: Secondary | ICD-10-CM

## 2019-11-26 DIAGNOSIS — M1712 Unilateral primary osteoarthritis, left knee: Secondary | ICD-10-CM | POA: Diagnosis not present

## 2019-11-26 DIAGNOSIS — M7989 Other specified soft tissue disorders: Secondary | ICD-10-CM | POA: Diagnosis not present

## 2019-11-26 NOTE — Progress Notes (Signed)
Subjective:  Patient ID: Allison Wright, female    DOB: [birth year]  Age: 54 y.o. MRN: [id number]  CC:  Chief Complaint  Patient presents with  . Knee Pain    Pain in L knee. Pt states it has been bothing her for 2 weeks now. Pt reports she has been taking meloxicam and iceing the area. this has helped a little bit per pt. pt states she has notice some swelling. pt wares a brace and also has used voltarin cream these have helped some.    HPI ARMILDA VANDERLINDEN presents for   Left knee pain: New pain. past 2 weeks, No known injury. Noticed just with walking. Noticed popping at times for months, without pain, then sore 2 weeks ago.  Front, outside and behind knee.  No prior surgery, injection, injury.  L knee pain in June 2020,treated with mobic, web reaction brace.  Saw Jari Sportsman last month for knee pain - rx meloxicam initially daily, then as needed. Pain improved, then restarted 2 weeks ago.  No falls/injuries. Walking for exercise.   Attempted treatments: Voltaren cream once per day, neoprene brace, meloxicam daily past week. Min improvement. Occasional feeling of giving way. Unsteady initially If sitting for awhile.   History Patient Active Problem List   Diagnosis Date Noted  . Dizziness 01/30/2018  . Stress fracture of left calcaneus 08/28/2017  . Prediabetes 10/12/2016  . Overweight 10/12/2016  . Systolic ejection murmur 10/09/2016   Past Medical History:  Diagnosis Date  . Allergy   . Heart murmur   . Post-menopause    Past Surgical History:  Procedure Laterality Date  . ABDOMINAL HYSTERECTOMY  2001   Retained cervix   Allergies  Allergen Reactions  . Other     Seasonal   Prior to Admission medications   Medication Sig Start Date End Date Taking? Authorizing Provider  acetic acid-hydrocortisone (VOSOL-HC) OTIC solution Place 4 drops into the right ear 3 (three) times daily. 10/15/19  Yes Janeece Agee, NP  fluticasone (FLONASE) 50 MCG/ACT nasal spray Place  2 sprays into both nostrils daily. 10/15/19  Yes Janeece Agee, NP  meclizine (ANTIVERT) 12.5 MG tablet Take 1 tablet (12.5 mg total) by mouth 3 (three) times daily as needed for dizziness. 10/15/19  Yes Janeece Agee, NP  meloxicam (MOBIC) 7.5 MG tablet Take 1 tablet (7.5 mg total) by mouth daily. 10/15/19  Yes Janeece Agee, NP  Vitamin D, Ergocalciferol, (DRISDOL) 1.25 MG (50000 UT) CAPS capsule TAKE 1 CAPSULE (50,000 UNITS TOTAL) BY MOUTH EVERY 7 (SEVEN) DAYS. 06/04/18  Yes Georgina Quint, MD   Social History   Socioeconomic History  . Marital status: Single    Spouse name: Not on file  . Number of children: Not on file  . Years of education: Not on file  . Highest education level: Not on file  Occupational History  . Not on file  Tobacco Use  . Smoking status: Former Smoker    Packs/day: 0.50    Years: 10.00    Pack years: 5.00    Quit date: 05/29/1996    Years since quitting: 23.5  . Smokeless tobacco: Never Used  Substance and Sexual Activity  . Alcohol use: No    Alcohol/week: 0.0 standard drinks  . Drug use: No  . Sexual activity: Never  Other Topics Concern  . Not on file  Social History Narrative  . Not on file   Social Determinants of Health   Financial Resource Strain:   .  Difficulty of Paying Living Expenses:   Food Insecurity:   . Worried About Programme researcher, broadcasting/film/video in the Last Year:   . Barista in the Last Year:   Transportation Needs:   . Freight forwarder (Medical):   Marland Kitchen Lack of Transportation (Non-Medical):   Physical Activity:   . Days of Exercise per Week:   . Minutes of Exercise per Session:   Stress:   . Feeling of Stress :   Social Connections:   . Frequency of Communication with Friends and Family:   . Frequency of Social Gatherings with Friends and Family:   . Attends Religious Services:   . Active Member of Clubs or Organizations:   . Attends Banker Meetings:   Marland Kitchen Marital Status:   Intimate Partner  Violence:   . Fear of Current or Ex-Partner:   . Emotionally Abused:   Marland Kitchen Physically Abused:   . Sexually Abused:     Review of Systems Per HPI.  No new night sweats, no fever or weight loss.    Objective:   Vitals:   11/26/19 1330  BP: 108/66  Pulse: 75  Temp: 98.4 F (36.9 C)  TempSrc: Temporal  SpO2: 98%  Weight: 212 lb (96.2 kg)  Height: 5\' 4"  (1.626 m)    Physical Exam Vitals reviewed.  Constitutional:      General: She is not in acute distress.    Appearance: She is well-developed.  HENT:     Head: Normocephalic and atraumatic.  Cardiovascular:     Rate and Rhythm: Normal rate.  Pulmonary:     Effort: Pulmonary effort is normal.  Musculoskeletal:     Comments: Left knee slight effusion skin intact, no erythema, no lesions.  Full range of motion but some lateral pain at full flexion.  Minimal crepitus.  Tender to palpation at lateral joint line.  Pain at lateral joint line with McMurray.  Negative drawer, varus, valgus.  Neurological:     Mental Status: She is alert and oriented to person, place, and time.   DG Knee Complete 4 Views Left  Result Date: 11/26/2019 CLINICAL DATA:  LEFT knee pain and swelling, no known injury EXAM: LEFT KNEE - COMPLETE 4+ VIEW COMPARISON:  None FINDINGS: Medial compartment and mild patellofemoral joint space narrowing with tiny marginal spurs. Osseous mineralization normal. No acute fracture, dislocation, or bone destruction. No joint effusion. IMPRESSION: Degenerative changes LEFT knee. No acute osseous abnormalities. Electronically Signed   By: 11/28/2019 M.D.   On: 11/26/2019 14:14     Assessment & Plan:  SATORIA DUNLOP is a 54 y.o. female . Left knee pain, unspecified chronicity - Plan: DG Knee Complete 4 Views Left, Ambulatory referral to Orthopedic Surgery  -Recurrent, now with some instability past few weeks.  No known injury.  Differential includes meniscal pathology.  Continue brace, meloxicam for now, check imaging, refer  to orthopedics to decide on advanced imaging versus initial trial of corticosteroid injection.   No orders of the defined types were placed in this encounter.  Patient Instructions     If you have lab work done today you will be contacted with your lab results within the next 2 weeks.  If you have not heard from 53 then please contact us. The fastest way to get your results is to register for My Chart.   IF you received an x-ray today, you will receive an invoice from [facility]. Please contact [facility] at [phone] with  questions or concerns regarding your invoice.   IF you received labwork today, you will receive an invoice from [facility]. Please contact [facility] at [phone] with questions or concerns regarding your invoice.   Our billing staff will not be able to assist you with questions regarding bills from these companies.  You will be contacted with the lab results as soon as they are available. The fastest way to get your results is to activate your My Chart account. Instructions are located on the last page of this paperwork. If you have not heard from [place] regarding the results in 2 weeks, please contact this office.         Signed, Merri Ray, MD [facility]

## 2019-11-26 NOTE — Patient Instructions (Addendum)
I am suspicious of a possible meniscus issue with your left knee.  I will refer you to orthopedics.  Okay to continue meloxicam for now, and will let you know if there are any concerns on your x-ray.  Continue to wear the brace, and can try that without the liner, as that may be less likely to move.   Acute Knee Pain, Adult Many things can cause knee pain. Sometimes, knee pain is sudden (acute) and may be caused by damage, swelling, or irritation of the muscles and tissues that support your knee. The pain often goes away on its own with time and rest. If the pain does not go away, tests may be done to find out what is causing the pain. Follow these instructions at home: Pay attention to any changes in your symptoms. Take these actions to relieve your pain. If you have a knee sleeve or brace:   Wear the sleeve or brace as told by your doctor. Remove it only as told by your doctor.  Loosen the sleeve or brace if your toes: ? Tingle. ? Become numb. ? Turn cold and blue.  Keep the sleeve or brace clean.  If the sleeve or brace is not waterproof: ? Do not let it get wet. ? Cover it with a watertight covering when you take a bath or shower. Activity  Rest your knee.  Do not do things that cause pain.  Avoid activities where both feet leave the ground at the same time (high-impact activities). Examples are running, jumping rope, and doing jumping jacks.  Work with a physical therapist to make a safe exercise program, as told by your doctor. Managing pain, stiffness, and swelling   If told, put ice on the knee: ? Put ice in a plastic bag. ? Place a towel between your skin and the bag. ? Leave the ice on for 20 minutes, 2-3 times a day.  If told, put pressure (compression) on your injured knee to control swelling, give support, and help with discomfort. Compression may be done with an elastic bandage. General instructions  Take all medicines only as told by your doctor.  Raise  (elevate) your knee while you are sitting or lying down. Make sure your knee is higher than your heart.  Sleep with a pillow under your knee.  Do not use any products that contain nicotine or tobacco. These include cigarettes, e-cigarettes, and chewing tobacco. These products may slow down healing. If you need help quitting, ask your doctor.  If you are overweight, work with your doctor and a food expert (dietitian) to set goals to lose weight. Being overweight can make your knee hurt more.  Keep all follow-up visits as told by your doctor. This is important. Contact a doctor if:  The knee pain does not stop.  The knee pain changes or gets worse.  You have a fever along with knee pain.  Your knee feels warm when you touch it.  Your knee gives out or locks up. Get help right away if:  Your knee swells, and the swelling gets worse.  You cannot move your knee.  You have very bad knee pain. Summary  Many things can cause knee pain. The pain often goes away on its own with time and rest.  Your doctor may do tests to find out the cause of the pain.  Pay attention to any changes in your symptoms. Relieve your pain with rest, medicines, light activity, and use of ice.  Get help  right away if you cannot move your knee or your knee pain is very bad. This information is not intended to replace advice given to you by your health care provider. Make sure you discuss any questions you have with your health care provider. Document Revised: 10/25/2017 Document Reviewed: 10/25/2017 Elsevier Patient Education  [employer].   If you have lab work done today you will be contacted with your lab results within the next 2 weeks.  If you have not heard from [place] then please contact us. The fastest way to get your results is to register for My Chart.   IF you received an x-ray today, you will receive an invoice from [facility]. Please contact [facility] at [phone]  with questions or concerns regarding your invoice.   IF you received labwork today, you will receive an invoice from [facility]. Please contact [facility] at [phone] with questions or concerns regarding your invoice.   Our billing staff will not be able to assist you with questions regarding bills from these companies.  You will be contacted with the lab results as soon as they are available. The fastest way to get your results is to activate your My Chart account. Instructions are located on the last page of this paperwork. If you have not heard from [place] regarding the results in 2 weeks, please contact this office.

## 2019-12-04 ENCOUNTER — Ambulatory Visit: Payer: Federal, State, Local not specified - PPO | Admitting: Orthopaedic Surgery

## 2019-12-04 ENCOUNTER — Encounter: Payer: Self-pay | Admitting: Orthopaedic Surgery

## 2019-12-04 VITALS — Ht 64.0 in | Wt 208.0 lb

## 2019-12-04 DIAGNOSIS — M1712 Unilateral primary osteoarthritis, left knee: Secondary | ICD-10-CM

## 2019-12-04 MED ORDER — BUPIVACAINE HCL 0.25 % IJ SOLN
2.0000 mL | INTRAMUSCULAR | Status: AC | PRN
  Administered 2019-12-04: 2 mL via INTRA_ARTICULAR

## 2019-12-04 MED ORDER — METHYLPREDNISOLONE ACETATE 40 MG/ML IJ SUSP
40.0000 mg | INTRAMUSCULAR | Status: AC | PRN
  Administered 2019-12-04: 40 mg via INTRA_ARTICULAR

## 2019-12-04 MED ORDER — LIDOCAINE HCL 1 % IJ SOLN
2.0000 mL | INTRAMUSCULAR | Status: AC | PRN
  Administered 2019-12-04: 2 mL

## 2019-12-04 NOTE — Progress Notes (Signed)
Office Visit Note   Patient: Allison Wright           Date of Birth: 02/04/1966           MRN: [id number] Visit Date: 12/04/2019              Requested by: Shade Flood, MD [address] [place],  [place] [address] PCP: Shade Flood, MD   Assessment & Plan: Visit Diagnoses:  1. Unilateral primary osteoarthritis, left knee     Plan: Impression is left knee arthritis flareup versus less likely meniscal pathology.  We will proceed with left knee intra-articular cortisone injection today.  We discussed viscosupplementation injection versus possible MRI if she fails to have significant improvement following the cortisone injection.  She will follow up with [place] as needed.  Follow-Up Instructions: Return if symptoms worsen or fail to improve.   Orders:  Orders Placed This Encounter  Procedures  . Large Joint Inj: L knee   No orders of the defined types were placed in this encounter.     Procedures: Large Joint Inj: L knee on 12/04/2019 8:37 AM Indications: pain Details: 22 G needle, anterolateral approach Medications: 2 mL lidocaine 1 %; 2 mL bupivacaine 0.25 %; 40 mg methylPREDNISolone acetate 40 MG/ML      Clinical Data: No additional findings.   Subjective: Chief Complaint  Patient presents with  . Left Knee - Pain    HPI patient is a pleasant 54 year old female who comes in today with left knee pain.  This began approximately 1 year ago and is worsened over the past 2 weeks.  There has not been a specific injury or change in activity leading up to her symptoms.  The pain she has is primarily to the medial and lateral joint line.  She describes this as sharp shooting pain worse when she is pushing or pulling things at work.  She does notice increased stiffness when she is sitting for long period of time.  She has tried meloxicam without significant relief of symptoms.  No previous cortisone injection.  She has recently had x-rays of her knee which show mild medial  and patellofemoral osteoarthritis.  Review of Systems as detailed in HPI.  All others reviewed and are negative.   Objective: Vital Signs: Ht 5\' 4"  (1.626 m)   Wt 208 lb (94.3 kg)   BMI 35.70 kg/m   Physical Exam well-developed and well-nourished female in no acute distress.  Alert and oriented x3.  Ortho Exam examination of her left knee shows no effusion.  Range of motion from 0 to 115 degrees.  She does have medial and lateral joint line tenderness.  Mild patellofemoral crepitus.  Ligaments are stable.  She is neurovascular intact distally.  Specialty Comments:  No specialty comments available.  Imaging: No new imaging   PMFS History: Patient Active Problem List   Diagnosis Date Noted  . Dizziness 01/30/2018  . Stress fracture of left calcaneus 08/28/2017  . Prediabetes 10/12/2016  . Overweight 10/12/2016  . Systolic ejection murmur 10/09/2016   Past Medical History:  Diagnosis Date  . Allergy   . Heart murmur   . Post-menopause     Family History  Problem Relation Age of Onset  . Cirrhosis Mother   . Alcohol abuse Mother   . Diabetes Father 85  . Heart attack Father   . Heart disease Paternal Grandmother     Past Surgical History:  Procedure Laterality Date  . ABDOMINAL HYSTERECTOMY  2001  Retained cervix   Social History   Occupational History  . Not on file  Tobacco Use  . Smoking status: Former Smoker    Packs/day: 0.50    Years: 10.00    Pack years: 5.00    Quit date: 05/29/1996    Years since quitting: 23.5  . Smokeless tobacco: Never Used  Substance and Sexual Activity  . Alcohol use: No    Alcohol/week: 0.0 standard drinks  . Drug use: No  . Sexual activity: Never

## 2019-12-18 ENCOUNTER — Telehealth: Payer: Self-pay | Admitting: Orthopaedic Surgery

## 2019-12-18 DIAGNOSIS — M1712 Unilateral primary osteoarthritis, left knee: Secondary | ICD-10-CM

## 2019-12-18 NOTE — Telephone Encounter (Signed)
LVM for pt to call back to discuss.  

## 2019-12-18 NOTE — Addendum Note (Signed)
Addended byPrescott Parma on: 12/18/2019 03:45 PM   Modules accepted: Orders

## 2019-12-18 NOTE — Telephone Encounter (Signed)
Patient called.   Her injection was ineffective and she would like a call back to discuss her next course of action  Call back: [phone]

## 2019-12-18 NOTE — Telephone Encounter (Signed)
MRI r/o structural abnls

## 2020-01-05 ENCOUNTER — Other Ambulatory Visit: Payer: Self-pay | Admitting: Registered Nurse

## 2020-01-05 DIAGNOSIS — M25562 Pain in left knee: Secondary | ICD-10-CM

## 2020-01-12 ENCOUNTER — Ambulatory Visit
Admission: RE | Admit: 2020-01-12 | Discharge: 2020-01-12 | Disposition: A | Discharge status: Home / Self Care | Payer: Federal, State, Local not specified - PPO | Source: Ambulatory Visit | Attending: Orthopaedic Surgery | Admitting: Orthopaedic Surgery

## 2020-01-12 DIAGNOSIS — M25562 Pain in left knee: Secondary | ICD-10-CM | POA: Diagnosis not present

## 2020-01-12 DIAGNOSIS — M1712 Unilateral primary osteoarthritis, left knee: Secondary | ICD-10-CM

## 2020-01-13 ENCOUNTER — Encounter: Payer: Self-pay | Admitting: Orthopaedic Surgery

## 2020-01-13 ENCOUNTER — Ambulatory Visit (INDEPENDENT_AMBULATORY_CARE_PROVIDER_SITE_OTHER): Payer: Federal, State, Local not specified - PPO | Admitting: Orthopaedic Surgery

## 2020-01-13 VITALS — Ht 64.0 in | Wt 214.0 lb

## 2020-01-13 DIAGNOSIS — M1712 Unilateral primary osteoarthritis, left knee: Secondary | ICD-10-CM | POA: Diagnosis not present

## 2020-01-13 NOTE — Progress Notes (Signed)
   Office Visit Note   Patient: Allison Wright           Date of Birth: 16-Feb-1966           MRN: [id number] Visit Date: 01/13/2020              Requested by: Shade Flood, MD [address] [place],  [place] [address] PCP: Shade Flood, MD   Assessment & Plan: Visit Diagnoses:  1. Unilateral primary osteoarthritis, left knee     Plan: I independently reviewed and interpreted the left knee MRI which shows medial meniscus tear of the posterior horn into the meniscal root with extrusion.  She does have tricompartment chondromalacia as well.  I see synovitis in the suprapatellar pouch.  I feel that her symptoms are more consistent with DJD rather than the meniscal tear.  She will take time to think about her options and let us know.  Follow-up as needed.  Follow-Up Instructions: Return if symptoms worsen or fail to improve.   Orders:  No orders of the defined types were placed in this encounter.  No orders of the defined types were placed in this encounter.     Procedures: No procedures performed   Clinical Data: No additional findings.   Subjective: Chief Complaint  Patient presents with  . Left Knee - Pain    Allison Wright is returning today for MRI review.  She is overall doing about the same.  She mainly has anterior knee pain is worse with prolonged sitting and using stairs.  Cortisone injection has helped a small amount.   Review of Systems   Objective: Vital Signs: Ht 5\' 4"  (1.626 m)   Wt 214 lb (97.1 kg)   BMI 36.73 kg/m   Physical Exam  Ortho Exam Left knee exam is unchanged.  She has no medial joint line tenderness.  Negative McMurray. Specialty Comments:  No specialty comments available.  Imaging: No results found.   PMFS History: Patient Active Problem List   Diagnosis Date Noted  . Dizziness 01/30/2018  . Stress fracture of left calcaneus 08/28/2017  . Prediabetes 10/12/2016  . Overweight 10/12/2016  . Systolic ejection murmur  10/09/2016   Past Medical History:  Diagnosis Date  . Allergy   . Heart murmur   . Post-menopause     Family History  Problem Relation Age of Onset  . Cirrhosis Mother   . Alcohol abuse Mother   . Diabetes Father 29  . Heart attack Father   . Heart disease Paternal Grandmother     Past Surgical History:  Procedure Laterality Date  . ABDOMINAL HYSTERECTOMY  2001   Retained cervix   Social History   Occupational History  . Not on file  Tobacco Use  . Smoking status: Former Smoker    Packs/day: 0.50    Years: 10.00    Pack years: 5.00    Quit date: 05/29/1996    Years since quitting: 23.6  . Smokeless tobacco: Never Used  Substance and Sexual Activity  . Alcohol use: No    Alcohol/week: 0.0 standard drinks  . Drug use: No  . Sexual activity: Never

## 2020-01-28 DIAGNOSIS — Z1272 Encounter for screening for malignant neoplasm of vagina: Secondary | ICD-10-CM | POA: Diagnosis not present

## 2020-01-28 DIAGNOSIS — N3281 Overactive bladder: Secondary | ICD-10-CM | POA: Insufficient documentation

## 2020-01-28 DIAGNOSIS — Z01419 Encounter for gynecological examination (general) (routine) without abnormal findings: Secondary | ICD-10-CM | POA: Diagnosis not present

## 2020-01-28 DIAGNOSIS — Z6836 Body mass index (BMI) 36.0-36.9, adult: Secondary | ICD-10-CM | POA: Diagnosis not present

## 2020-01-28 DIAGNOSIS — Z1231 Encounter for screening mammogram for malignant neoplasm of breast: Secondary | ICD-10-CM | POA: Diagnosis not present

## 2020-05-13 ENCOUNTER — Other Ambulatory Visit: Payer: Self-pay | Admitting: Registered Nurse

## 2020-05-13 DIAGNOSIS — M25562 Pain in left knee: Secondary | ICD-10-CM

## 2020-05-13 NOTE — Telephone Encounter (Signed)
Refilled with [employer].

## 2020-06-07 DIAGNOSIS — Z1152 Encounter for screening for COVID-19: Secondary | ICD-10-CM | POA: Diagnosis not present

## 2020-09-02 DIAGNOSIS — N3941 Urge incontinence: Secondary | ICD-10-CM | POA: Diagnosis not present

## 2020-09-02 DIAGNOSIS — R351 Nocturia: Secondary | ICD-10-CM | POA: Diagnosis not present

## 2020-10-11 NOTE — Progress Notes (Signed)
Virtual Visit via Telephone Note  I connected with Allison Wright, on 10/12/2020 at 9:47 AM by telephone due to the COVID-19 pandemic and verified that I am speaking with the correct person using two identifiers.  Due to current restrictions/limitations of in-office visits due to the COVID-19 pandemic, this scheduled clinical appointment was converted to a telehealth visit.   Consent: I discussed the limitations, risks, security and privacy concerns of performing an evaluation and management service by telephone and the availability of in person appointments. I also discussed with the patient that there may be a patient responsible charge related to this service. The patient expressed understanding and agreed to proceed.   Location of Patient: Home  Location of Provider: [facility]  Persons participating in Telemedicine visit: Xan L Aurea Graff, NP Margorie John, CMA  History of Present Illness: Allison Wright is a 55 year-old female who presents to establish care. PMH significant for heart murmur, post-menopause systolic ejection murmur, prediabetes, overweight, and dizziness.  Current issues and/or concerns: Reports she has bilateral knee arthritis. Left knee worse than right knee. Took Meloxicam in the past. Currently taking Voltaren gel as needed and helping. Would like to schedule annual physical exam soon.   Past Medical History:  Diagnosis Date  . Allergy   . Heart murmur   . Post-menopause    Allergies  Allergen Reactions  . Other     Seasonal    Current Outpatient Medications on File Prior to Visit  Medication Sig Dispense Refill  . meloxicam (MOBIC) 7.5 MG tablet TAKE 1 TABLET BY MOUTH EVERY DAY 20 tablet 0  . acetic acid-hydrocortisone (VOSOL-HC) OTIC solution Place 4 drops into the right ear 3 (three) times daily. 10 mL 0  . fluticasone (FLONASE) 50 MCG/ACT nasal spray Place 2 sprays into both nostrils daily. 16 g 6   . meclizine (ANTIVERT) 12.5 MG tablet Take 1 tablet (12.5 mg total) by mouth 3 (three) times daily as needed for dizziness. 30 tablet 0  . tiZANidine (ZANAFLEX) 4 MG tablet tizanidine 4 mg tablet    . tolterodine (DETROL LA) 4 MG 24 hr capsule Take 4 mg by mouth daily.     No current facility-administered medications on file prior to visit.    Observations/Objective: Alert and oriented x 3. Not in acute distress. Physical examination not completed as this is a telemedicine visit.  Assessment and Plan: 1. Encounter to establish care: - Patient presents today to establish care.  - Return for annual physical examination, labs, and health maintenance. Arrive fasting meaning having no food for at least 8 hours prior to appointment. You may have only water or black coffee. Please take scheduled medications as normal.  Follow Up Instructions: Return for annual physical exam.   Patient was given clear instructions to go to Emergency Department or return to medical center if symptoms don't improve, worsen, or new problems develop.The patient verbalized understanding.  I discussed the assessment and treatment plan with the patient. The patient was provided an opportunity to ask questions and all were answered. The patient agreed with the plan and demonstrated an understanding of the instructions.   The patient was advised to call back or seek an in-person evaluation if the symptoms worsen or if the condition fails to improve as anticipated.    I provided 10 minutes total of non-face-to-face time during this encounter.   Allison Wright Jodi Geralds, NP  [facility] at [facility],  [place] [phone] 10/12/2020, 9:47 AM

## 2020-10-12 ENCOUNTER — Other Ambulatory Visit: Payer: Self-pay

## 2020-10-12 ENCOUNTER — Telehealth (INDEPENDENT_AMBULATORY_CARE_PROVIDER_SITE_OTHER): Payer: Federal, State, Local not specified - PPO | Admitting: Family

## 2020-10-12 DIAGNOSIS — Z7689 Persons encountering health services in other specified circumstances: Secondary | ICD-10-CM | POA: Diagnosis not present

## 2020-10-12 NOTE — Progress Notes (Signed)
Establish care

## 2020-10-31 NOTE — Progress Notes (Addendum)
Patient ID: Allison Wright, female    DOB: [birth year]  MRN: [id number]  CC: Annual Physical Exam  Subjective: Allison Wright is a 55 y.o. female who presents for annual physical exam.   Her concerns today include: none.   Patient Active Problem List   Diagnosis Date Noted   Overactive bladder 01/28/2020   Dizziness 01/30/2018   Stress fracture of left calcaneus 08/28/2017   Prediabetes 10/12/2016   Overweight 09/31/1216   Systolic ejection murmur 24/46/9507     Current Outpatient Medications on File Prior to Visit  Medication Sig Dispense Refill   acetic acid-hydrocortisone (VOSOL-HC) OTIC solution Place 4 drops into the right ear 3 (three) times daily. 10 mL 0   fluticasone (FLONASE) 50 MCG/ACT nasal spray Place 2 sprays into both nostrils daily. 16 g 6   meclizine (ANTIVERT) 12.5 MG tablet Take 1 tablet (12.5 mg total) by mouth 3 (three) times daily as needed for dizziness. 30 tablet 0   meloxicam (MOBIC) 7.5 MG tablet TAKE 1 TABLET BY MOUTH EVERY DAY 20 tablet 0   tiZANidine (ZANAFLEX) 4 MG tablet tizanidine 4 mg tablet     tolterodine (DETROL LA) 4 MG 24 hr capsule Take 4 mg by mouth daily.     influenza vaccine (FLUCELVAX QUADRIVALENT) 0.5 ML injection Flucelvax Quad 2019-2020 (PF) 60 mcg (15 mcg x 4)/0.5 mL IM syringe  INJECT 0.5ML INTRAMUSCULARLY ONCE     Influenza Virus Vacc Split PF 0.5 ML SUSY Afluria 2014-2015(PF) 45 mcg (15 mcg x 3)/0.5 mL intramuscular syringe  inject 0.5 milliliter intramuscularly     Zoster Vaccine Adjuvanted (SHINGRIX) injection Shingrix (PF) 50 mcg/0.5 mL intramuscular suspension, kit     No current facility-administered medications on file prior to visit.    Allergies  Allergen Reactions   Other     Seasonal    Social History   Socioeconomic History   Marital status: Single    Spouse name: Not on file   Number of children: Not on file   Years of education: Not on file   Highest education level: Not on file  Occupational History    Not on file  Tobacco Use   Smoking status: Former    Packs/day: 0.50    Years: 10.00    Pack years: 5.00    Types: Cigarettes    Quit date: 05/29/1996    Years since quitting: 24.4   Smokeless tobacco: Never  Substance and Sexual Activity   Alcohol use: No    Alcohol/week: 0.0 standard drinks   Drug use: No   Sexual activity: Never  Other Topics Concern   Not on file  Social History Narrative   Not on file   Social Determinants of Health   Financial Resource Strain: Not on file  Food Insecurity: Not on file  Transportation Needs: Not on file  Physical Activity: Not on file  Stress: Not on file  Social Connections: Not on file  Intimate Partner Violence: Not on file    Family History  Problem Relation Age of Onset   Cirrhosis Mother    Alcohol abuse Mother    Diabetes Father 34   Heart attack Father    Heart disease Paternal Grandmother     Past Surgical History:  Procedure Laterality Date   ABDOMINAL HYSTERECTOMY  2001   Retained cervix    ROS: Review of Systems Negative except as stated above  PHYSICAL EXAM: BP 139/84 (BP Location: Left Arm, Patient Position: Sitting, Cuff Size: Normal)  Pulse 62   Temp 98.1 F (36.7 C)   Resp 15   Ht 5' 4.02" (1.626 m)   Wt 215 lb 3.2 oz (97.6 kg)   SpO2 98%   BMI 36.92 kg/m   Physical Exam General appearance - alert, well appearing, and in no distress and oriented to person, place, and time Mental status - alert, oriented to person, place, and time, normal mood, behavior, speech, dress, motor activity, and thought processes Eyes - pupils equal and reactive, extraocular eye movements intact Ears - bilateral TM's and external ear canals normal Nose - normal and patent, no erythema, discharge or polyps Mouth - mucous membranes moist, pharynx normal without lesions Neck - supple, no significant adenopathy Lymphatics - no palpable lymphadenopathy, no hepatosplenomegaly Chest - clear to auscultation, no wheezes,  rales or rhonchi, symmetric air entry, no tachypnea, retractions or cyanosis Heart - normal rate, regular rhythm, normal S1, S2, no murmurs, rubs, clicks or gallops Abdomen - soft, nontender, nondistended, no masses or organomegaly Breasts - patient declined examination  Pelvic - patient declined examination Neurological - alert, oriented, normal speech, no focal findings or movement disorder noted, neck supple without rigidity, cranial nerves II through XII intact, DTR's normal and symmetric, motor and sensory grossly normal bilaterally, normal muscle tone, no tremors, strength 5/5 Musculoskeletal - no joint tenderness, deformity or swelling, full range of motion without pain Extremities - peripheral pulses normal, no pedal edema, no clubbing or cyanosis Skin - normal coloration and turgor, no rashes, no suspicious skin lesions noted  ASSESSMENT AND PLAN: 1. Annual physical exam: - Counseled on 150 minutes of exercise per week as tolerated, healthy eating (including decreased daily intake of saturated fats, cholesterol, added sugars, sodium), STI prevention, and routine healthcare maintenance.  2. Screening for metabolic disorder: - CMP to check kidney function, liver function, and electrolyte balance.  - Comprehensive metabolic panel  3. Screening for deficiency anemia: - CBC to screen for anemia. - CBC  4. Diabetes mellitus screening: - Hemoglobin A1c to screen for pre-diabetes/diabetes. - Hemoglobin A1c  5. Screening cholesterol level: - Lipid panel to screen for high cholesterol.  - Lipid panel  6. Thyroid disorder screen: - TSH to check thyroid function.  - TSH  7. Encounter for screening mammogram for malignant neoplasm of breast: - Patient reports she is up-to-date on mammogram screening. Reports she is established with a provider for routine exams.    Patient was given the opportunity to ask questions.  Patient verbalized understanding of the plan and was able to repeat  key elements of the plan. Patient was given clear instructions to go to Emergency Department or return to medical center if symptoms don't improve, worsen, or new problems develop.The patient verbalized understanding.   Orders Placed This Encounter  Procedures   CBC   Comprehensive metabolic panel   Lipid panel   TSH   Hemoglobin A1c    Follow-up with primary provider as scheduled.   Camillia Herter, NP

## 2020-11-01 ENCOUNTER — Other Ambulatory Visit: Payer: Self-pay

## 2020-11-01 ENCOUNTER — Ambulatory Visit (INDEPENDENT_AMBULATORY_CARE_PROVIDER_SITE_OTHER): Payer: Federal, State, Local not specified - PPO | Admitting: Family

## 2020-11-01 ENCOUNTER — Encounter: Payer: Self-pay | Admitting: Family

## 2020-11-01 VITALS — BP 139/84 | HR 62 | Temp 98.1°F | Resp 15 | Ht 64.02 in | Wt 215.2 lb

## 2020-11-01 DIAGNOSIS — Z13220 Encounter for screening for lipoid disorders: Secondary | ICD-10-CM

## 2020-11-01 DIAGNOSIS — Z1329 Encounter for screening for other suspected endocrine disorder: Secondary | ICD-10-CM | POA: Diagnosis not present

## 2020-11-01 DIAGNOSIS — Z13228 Encounter for screening for other metabolic disorders: Secondary | ICD-10-CM

## 2020-11-01 DIAGNOSIS — Z130 Encounter for screening for diseases of the blood and blood-forming organs and certain disorders involving the immune mechanism: Secondary | ICD-10-CM | POA: Diagnosis not present

## 2020-11-01 DIAGNOSIS — Z0000 Encounter for general adult medical examination without abnormal findings: Secondary | ICD-10-CM

## 2020-11-01 DIAGNOSIS — Z131 Encounter for screening for diabetes mellitus: Secondary | ICD-10-CM

## 2020-11-01 DIAGNOSIS — Z1231 Encounter for screening mammogram for malignant neoplasm of breast: Secondary | ICD-10-CM

## 2020-11-01 NOTE — Progress Notes (Signed)
Annual physical exam  

## 2020-11-01 NOTE — Patient Instructions (Signed)
Preventive Care 56-56 Years Old, Female Preventive care refers to lifestyle choices and visits with your health care provider that can promote health and wellness. This includes:  A yearly physical exam. This is also called an annual wellness visit.  Regular dental and eye exams.  Immunizations.  Screening for certain conditions.  Healthy lifestyle choices, such as: ? Eating a healthy diet. ? Getting regular exercise. ? Not using drugs or products that contain nicotine and tobacco. ? Limiting alcohol use. What can I expect for my preventive care visit? Physical exam Your health care provider will check your:  Height and weight. These may be used to calculate your BMI (body mass index). BMI is a measurement that tells if you are at a healthy weight.  Heart rate and blood pressure.  Body temperature.  Skin for abnormal spots. Counseling Your health care provider may ask you questions about your:  Past medical problems.  Family's medical history.  Alcohol, tobacco, and drug use.  Emotional well-being.  Home life and relationship well-being.  Sexual activity.  Diet, exercise, and sleep habits.  Work and work Statistician.  Access to firearms.  Method of birth control.  Menstrual cycle.  Pregnancy history. What immunizations do I need? Vaccines are usually given at various ages, according to a schedule. Your health care provider will recommend vaccines for you based on your age, medical history, and lifestyle or other factors, such as travel or where you work.   What tests do I need? Blood tests  Lipid and cholesterol levels. These may be checked every 5 years, or more often if you are over 56 years old.  Hepatitis C test.  Hepatitis B test. Screening  Lung cancer screening. You may have this screening every year starting at age 56 if you have a 30-pack-year history of smoking and currently smoke or have quit within the past 15 years.  Colorectal cancer  screening. ? All adults should have this screening starting at age 56 and continuing until age 17. ? Your health care provider may recommend screening at age 56 if you are at increased risk. ? You will have tests every 1-10 years, depending on your results and the type of screening test.  Diabetes screening. ? This is done by checking your blood sugar (glucose) after you have not eaten for a while (fasting). ? You may have this done every 1-3 years.  Mammogram. ? This may be done every 1-2 years. ? Talk with your health care provider about when you should start having regular mammograms. This may depend on whether you have a family history of breast cancer.  BRCA-related cancer screening. This may be done if you have a family history of breast, ovarian, tubal, or peritoneal cancers.  Pelvic exam and Pap test. ? This may be done every 3 years starting at age 56. ? Starting at age 56, this may be done every 5 years if you have a Pap test in combination with an HPV test. Other tests  STD (sexually transmitted disease) testing, if you are at risk.  Bone density scan. This is done to screen for osteoporosis. You may have this scan if you are at high risk for osteoporosis. Talk with your health care provider about your test results, treatment options, and if necessary, the need for more tests. Follow these instructions at home: Eating and drinking  Eat a diet that includes fresh fruits and vegetables, whole grains, lean protein, and low-fat dairy products.  Take vitamin and mineral supplements  as recommended by your health care provider.  Do not drink alcohol if: ? Your health care provider tells you not to drink. ? You are pregnant, may be pregnant, or are planning to become pregnant.  If you drink alcohol: ? Limit how much you have to 0-1 drink a day. ? Be aware of how much alcohol is in your drink. In the [place] one drink equals one 12 oz bottle of beer (355 mL), one 5 oz glass of  wine (148 mL), or one 1 oz glass of hard liquor (44 mL).   Lifestyle  Take daily care of your teeth and gums. Brush your teeth every morning and night with fluoride toothpaste. Floss one time each day.  Stay active. Exercise for at least 30 minutes 5 or more days each week.  Do not use any products that contain nicotine or tobacco, such as cigarettes, e-cigarettes, and chewing tobacco. If you need help quitting, ask your health care provider.  Do not use drugs.  If you are sexually active, practice safe sex. Use a condom or other form of protection to prevent STIs (sexually transmitted infections).  If you do not wish to become pregnant, use a form of birth control. If you plan to become pregnant, see your health care provider for a prepregnancy visit.  If told by your health care provider, take low-dose aspirin daily starting at age 56.  Find healthy ways to cope with stress, such as: ? Meditation, yoga, or listening to music. ? Journaling. ? Talking to a trusted person. ? Spending time with friends and family. Safety  Always wear your seat belt while driving or riding in a vehicle.  Do not drive: ? If you have been drinking alcohol. Do not ride with someone who has been drinking. ? When you are tired or distracted. ? While texting.  Wear a helmet and other protective equipment during sports activities.  If you have firearms in your house, make sure you follow all gun safety procedures. What's next?  Visit your health care provider once a year for an annual wellness visit.  Ask your health care provider how often you should have your eyes and teeth checked.  Stay up to date on all vaccines. This information is not intended to replace advice given to you by your health care provider. Make sure you discuss any questions you have with your health care provider. Document Revised: 02/17/2020 Document Reviewed: 01/24/2018 Elsevier Patient Education  2021 Elsevier Inc.  

## 2020-11-02 LAB — LIPID PANEL
Chol/HDL Ratio: 3.8 ratio (ref 0.0–4.4)
Cholesterol, Total: 284 mg/dL — ABNORMAL HIGH (ref 100–199)
HDL: 75 mg/dL (ref >39)
LDL Chol Calc (NIH): 192 mg/dL — ABNORMAL HIGH (ref 0–99)
Triglycerides: 98 mg/dL (ref 0–149)
VLDL Cholesterol Cal: 17 mg/dL (ref 5–40)

## 2020-11-02 LAB — COMPREHENSIVE METABOLIC PANEL
ALT: 19 IU/L (ref 0–32)
AST: 17 IU/L (ref 0–40)
Albumin/Globulin Ratio: 1.3 (ref 1.2–2.2)
Albumin: 4.5 g/dL (ref 3.8–4.9)
Alkaline Phosphatase: 90 IU/L (ref 44–121)
BUN/Creatinine Ratio: 19 (ref 9–23)
BUN: 14 mg/dL (ref 6–24)
Bilirubin Total: <0.2 mg/dL (ref 0.0–1.2)
CO2: 22 mmol/L (ref 20–29)
Calcium: 9.6 mg/dL (ref 8.7–10.2)
Chloride: 103 mmol/L (ref 96–106)
Creatinine, Ser: 0.74 mg/dL (ref 0.57–1.00)
Globulin, Total: 3.4 g/dL (ref 1.5–4.5)
Glucose: 81 mg/dL (ref 65–99)
Potassium: 4.8 mmol/L (ref 3.5–5.2)
Sodium: 142 mmol/L (ref 134–144)
Total Protein: 7.9 g/dL (ref 6.0–8.5)
eGFR: 95 mL/min/{1.73_m2} (ref >59)

## 2020-11-02 LAB — CBC
Hematocrit: 40.1 % (ref 34.0–46.6)
Hemoglobin: 12.6 g/dL (ref 11.1–15.9)
MCH: 28.4 pg (ref 26.6–33.0)
MCHC: 31.4 g/dL — ABNORMAL LOW (ref 31.5–35.7)
MCV: 90 fL (ref 79–97)
Platelets: 372 10*3/uL (ref 150–450)
RBC: 4.44 x10E6/uL (ref 3.77–5.28)
RDW: 14.5 % (ref 11.7–15.4)
WBC: 9.6 10*3/uL (ref 3.4–10.8)

## 2020-11-02 LAB — HEMOGLOBIN A1C
Est. average glucose Bld gHb Est-mCnc: 126 mg/dL
Hgb A1c MFr Bld: 6 % — ABNORMAL HIGH (ref 4.8–5.6)

## 2020-11-02 LAB — TSH: TSH: 1.65 u[IU]/mL (ref 0.450–4.500)

## 2020-11-02 NOTE — Progress Notes (Signed)
Kidney function normal.   Liver function normal.   Thyroid function normal.  No anemia.   Hemoglobin A1c is consistent with pre-diabetes. Practice healthy eating habits of fresh fruit and vegetables, lean baked meats such as chicken, fish, and [place]; limit breads, rice, pastas, and desserts; practice regular aerobic exercise (at least 150 minutes a week as tolerated). Encouraged to recheck in 6 months.   Cholesterol higher than expected. High cholesterol may increase risk of heart attack and/or stroke. Consider eating more fruits, vegetables, and lean baked meats such as chicken or fish. Moderate intensity exercise at least 150 minutes as tolerated per week may help as well. Encouraged to recheck in 3 to 6 months.  The following is for provider reference only: The 10-year ASCVD risk score Denman George DC Montez Hageman., et al., 2013) is: 4.9%   Values used to calculate the score:     Age: 55 years     Sex: Female     Is Non-Hispanic African American: Yes     Diabetic: No     Tobacco smoker: No     Systolic Blood Pressure: 139 mmHg     Is BP treated: No     HDL Cholesterol: 75 mg/dL     Total Cholesterol: 284 mg/dL

## 2021-03-04 DIAGNOSIS — Z1272 Encounter for screening for malignant neoplasm of vagina: Secondary | ICD-10-CM | POA: Diagnosis not present

## 2021-03-04 DIAGNOSIS — Z01419 Encounter for gynecological examination (general) (routine) without abnormal findings: Secondary | ICD-10-CM | POA: Diagnosis not present

## 2021-03-04 DIAGNOSIS — Z6838 Body mass index (BMI) 38.0-38.9, adult: Secondary | ICD-10-CM | POA: Diagnosis not present

## 2021-03-04 DIAGNOSIS — Z1231 Encounter for screening mammogram for malignant neoplasm of breast: Secondary | ICD-10-CM | POA: Diagnosis not present

## 2021-03-18 DIAGNOSIS — M7541 Impingement syndrome of right shoulder: Secondary | ICD-10-CM | POA: Diagnosis not present

## 2021-03-18 DIAGNOSIS — M542 Cervicalgia: Secondary | ICD-10-CM | POA: Diagnosis not present

## 2021-05-05 DIAGNOSIS — M7541 Impingement syndrome of right shoulder: Secondary | ICD-10-CM | POA: Diagnosis not present

## 2021-05-30 IMAGING — MG MM DIGITAL DIAGNOSTIC UNILAT*R* W/ TOMO W/ CAD
6 series · 6 of 18 positions shown · non-contrast
Comparison: Previous exam(s).

CLINICAL DATA: Patient presents with focal right breast pain for
approximately 1 [DATE] weeks.

EXAM:
DIGITAL DIAGNOSTIC RIGHT MAMMOGRAM WITH TOMO
ULTRASOUND RIGHT BREAST

[R ML synth-2D]
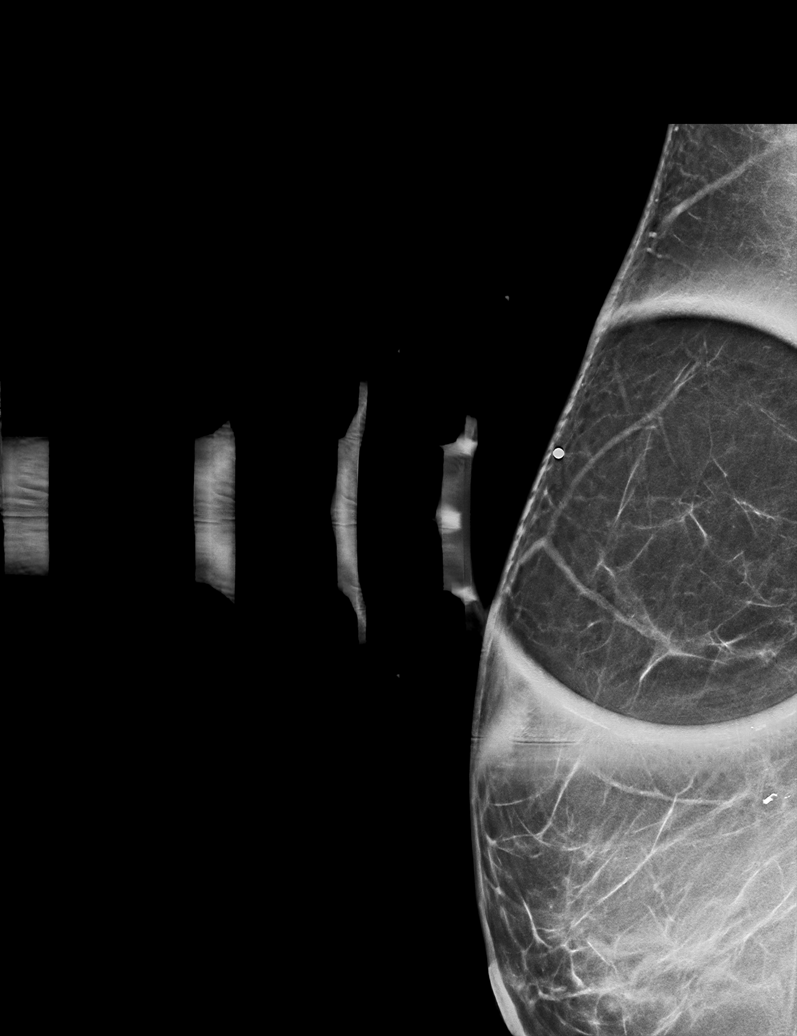

[R MLO synth-2D]
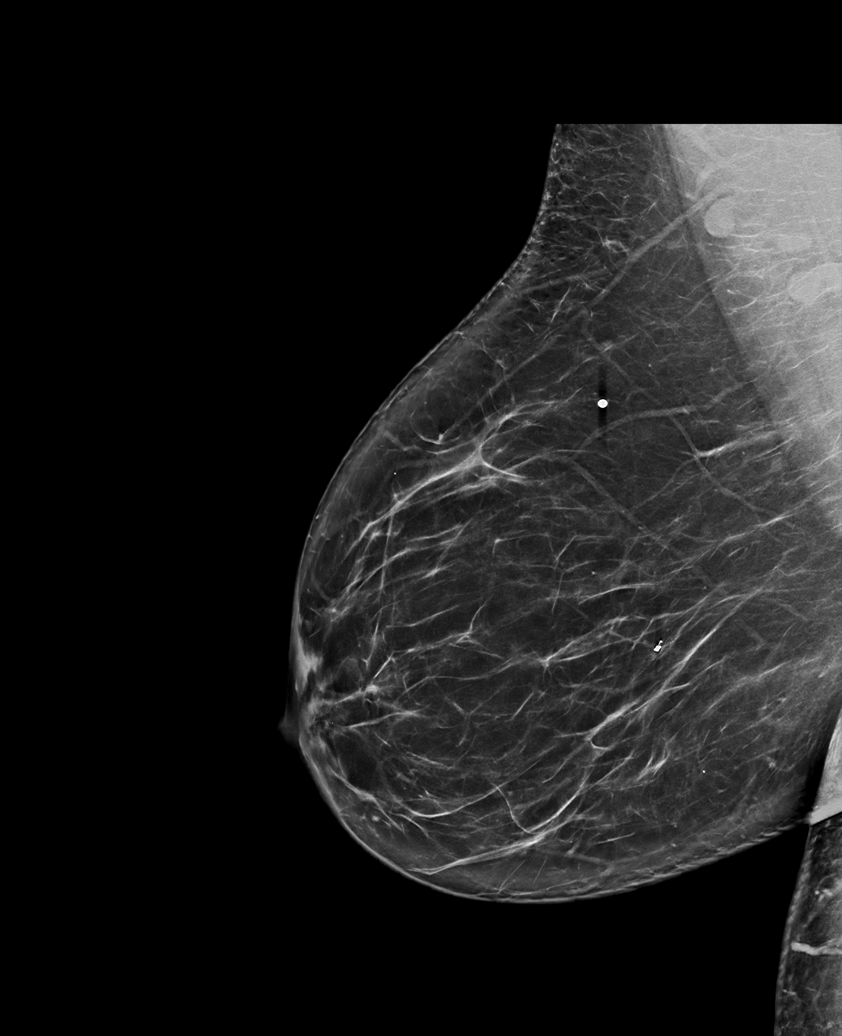

[R CC synth-2D]
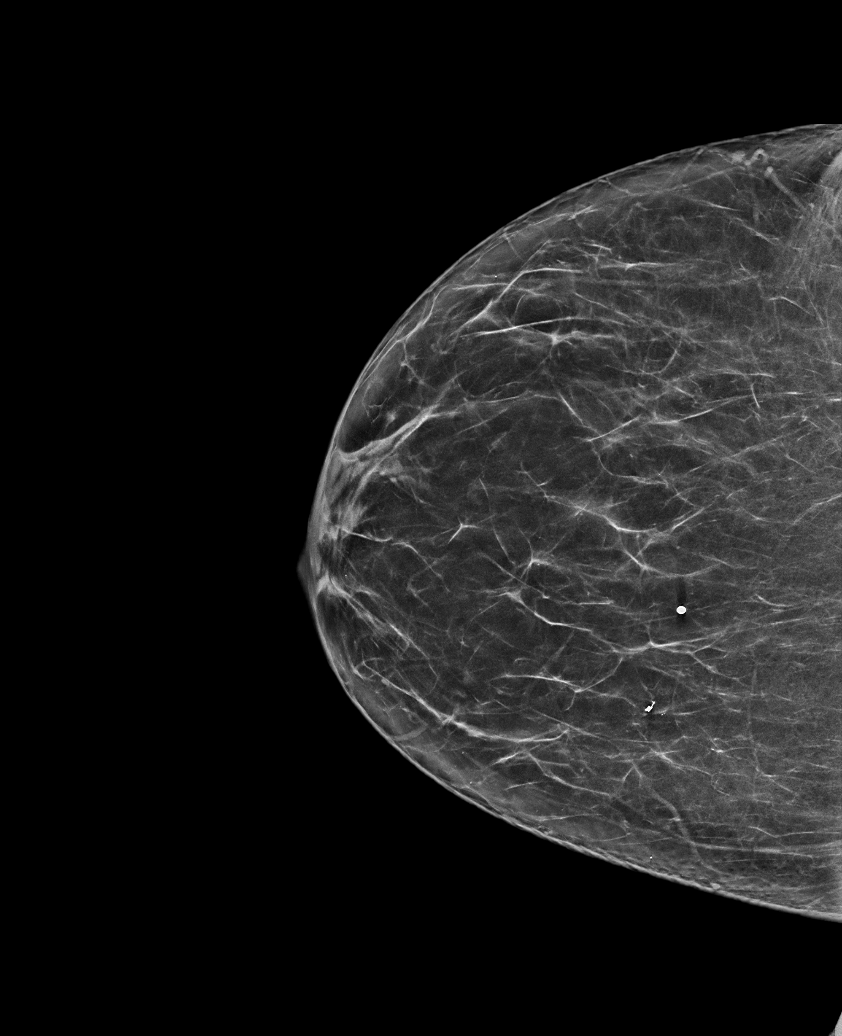

[R MLO tomo · tomo slice 44/87.0]
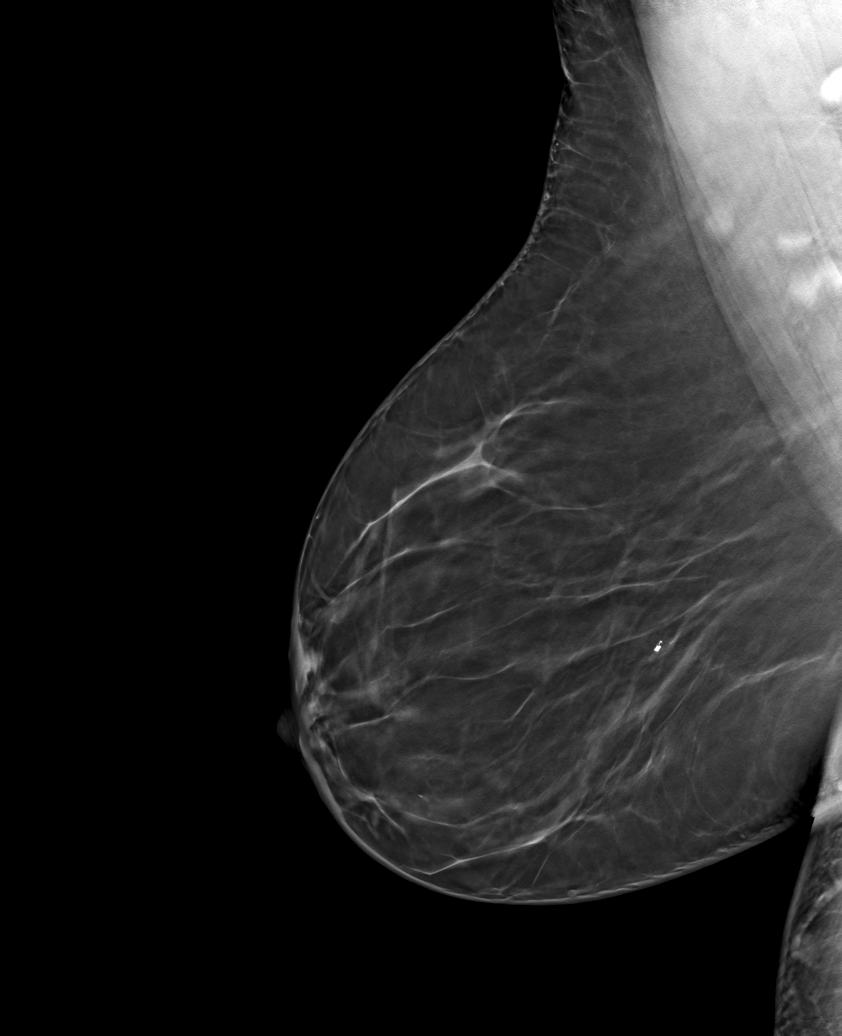

[R CC tomo · tomo slice 37/72.0]
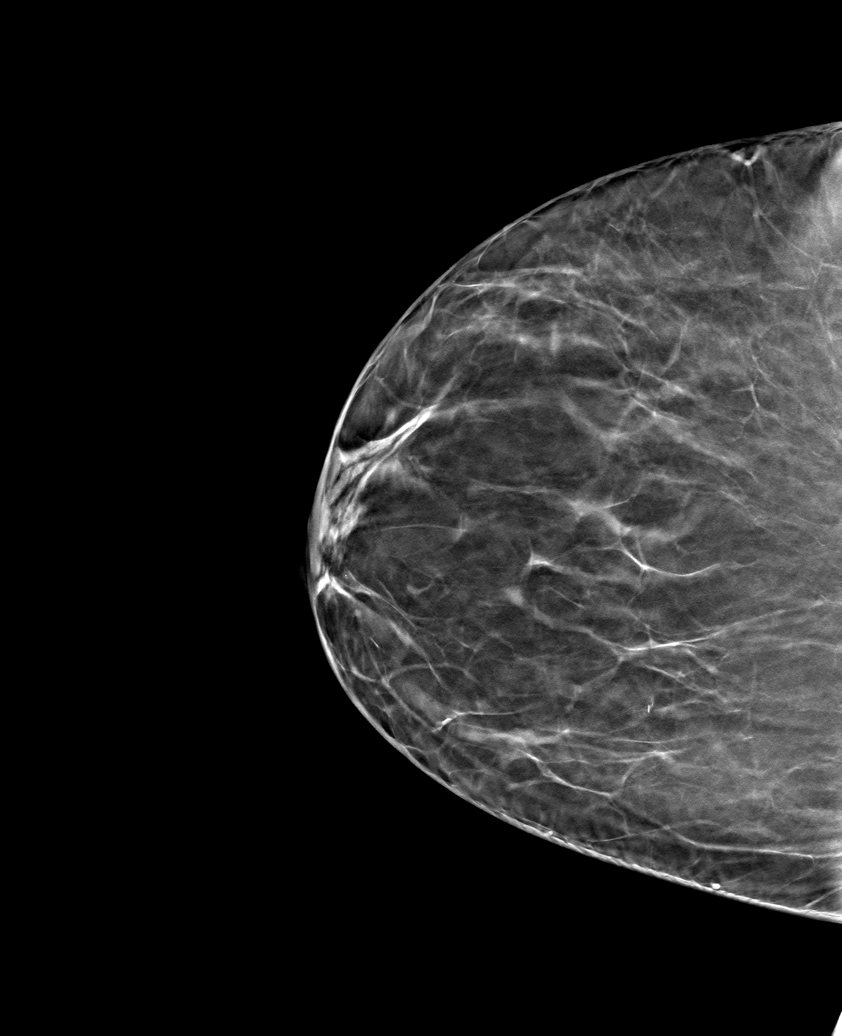

[R ML tomo · tomo slice 32/63.0]
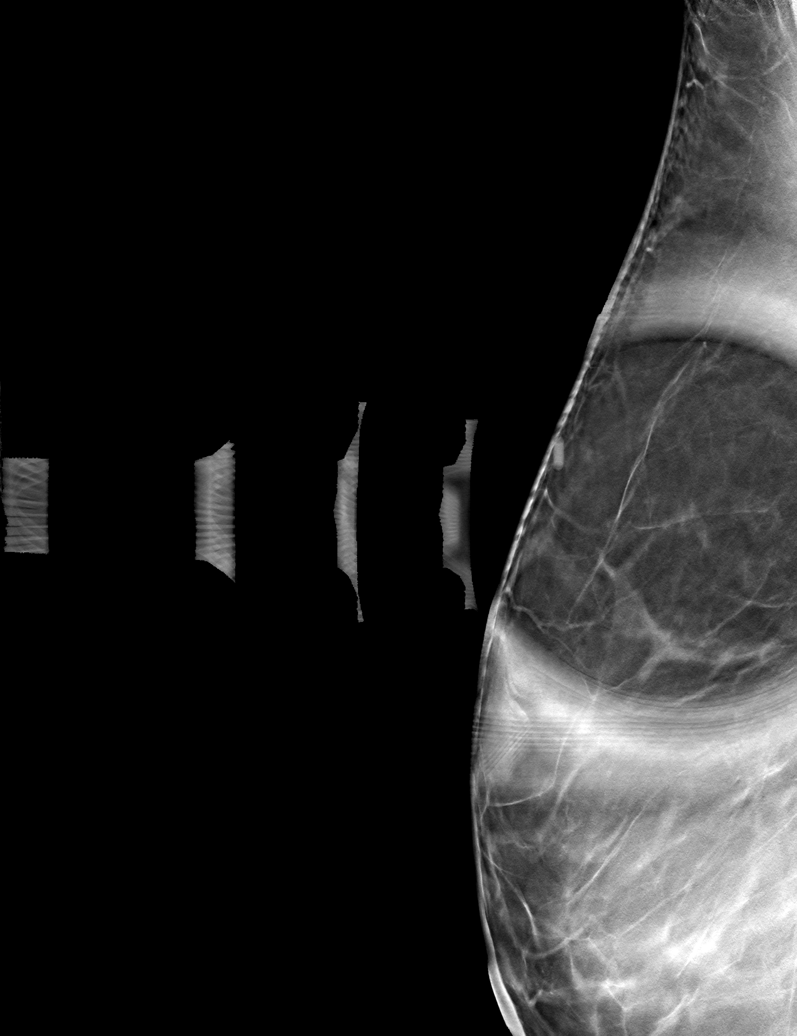

[6 of 18 positions shown; findings below may reference images not displayed]

ACR Breast Density Category b: There are scattered areas of
fibroglandular density.
FINDINGS: Mammogram:

No suspicious mass, distortion, or microcalcifications are
identified to suggest presence of malignancy. Additional spot
compression tomosynthesis views were performed at the site of pain
in the upper right breast. There is no focal abnormality.

Ultrasound:

Targeted ultrasound is performed in the right breast at 1 o'clock 8
cm from the nipple at the site of patient's pain which demonstrates
no suspicious cystic or solid mass.
IMPRESSION: No mammographic or sonographic finding to explain the patient's
right breast pain. No evidence of malignancy.

RECOMMENDATION:
Return to routine annual screening mammography which will be due in
December 2019.

We discussed some of the issues regarding breast pain, including
limiting caffeine, wearing adequate support, over-the-counter pain
medication, low-fat diet, exercise, and ice as needed.

I have discussed the findings and recommendations with the patient.
If applicable, a reminder letter will be sent to the patient
regarding the next appointment.

BI-RADS CATEGORY  1: Negative.

## 2021-08-17 ENCOUNTER — Emergency Department (HOSPITAL_COMMUNITY): Payer: Federal, State, Local not specified - PPO

## 2021-08-17 ENCOUNTER — Other Ambulatory Visit: Payer: Self-pay

## 2021-08-17 ENCOUNTER — Emergency Department (HOSPITAL_COMMUNITY)
Admission: EM | Admit: 2021-08-17 | Discharge: 2021-08-17 | Disposition: A | Discharge status: Home / Self Care | Payer: Federal, State, Local not specified - PPO | Attending: Emergency Medicine | Admitting: Emergency Medicine

## 2021-08-17 ENCOUNTER — Encounter (HOSPITAL_COMMUNITY): Payer: Self-pay

## 2021-08-17 DIAGNOSIS — D649 Anemia, unspecified: Secondary | ICD-10-CM | POA: Diagnosis not present

## 2021-08-17 DIAGNOSIS — I10 Essential (primary) hypertension: Secondary | ICD-10-CM | POA: Diagnosis not present

## 2021-08-17 DIAGNOSIS — Z79899 Other long term (current) drug therapy: Secondary | ICD-10-CM | POA: Insufficient documentation

## 2021-08-17 DIAGNOSIS — R519 Headache, unspecified: Secondary | ICD-10-CM | POA: Insufficient documentation

## 2021-08-17 HISTORY — DX: Unspecified osteoarthritis, unspecified site: M19.90

## 2021-08-17 HISTORY — DX: Dizziness and giddiness: R42

## 2021-08-17 LAB — CBC WITH DIFFERENTIAL/PLATELET
Abs Immature Granulocytes: 0.03 10*3/uL (ref 0.00–0.07)
Basophils Absolute: 0 10*3/uL (ref 0.0–0.1)
Basophils Relative: 0 %
Eosinophils Absolute: 0.2 10*3/uL (ref 0.0–0.5)
Eosinophils Relative: 2 %
HCT: 36.2 % (ref 36.0–46.0)
Hemoglobin: 11.5 g/dL — ABNORMAL LOW (ref 12.0–15.0)
Immature Granulocytes: 0 %
Lymphocytes Relative: 35 %
Lymphs Abs: 3.5 10*3/uL (ref 0.7–4.0)
MCH: 29.1 pg (ref 26.0–34.0)
MCHC: 31.8 g/dL (ref 30.0–36.0)
MCV: 91.6 fL (ref 80.0–100.0)
Monocytes Absolute: 0.7 10*3/uL (ref 0.1–1.0)
Monocytes Relative: 7 %
Neutro Abs: 5.7 10*3/uL (ref 1.7–7.7)
Neutrophils Relative %: 56 %
Platelets: 329 10*3/uL (ref 150–400)
RBC: 3.95 MIL/uL (ref 3.87–5.11)
RDW: 14.6 % (ref 11.5–15.5)
WBC: 10.1 10*3/uL (ref 4.0–10.5)
nRBC: 0 % (ref 0.0–0.2)

## 2021-08-17 LAB — BASIC METABOLIC PANEL
Anion gap: 10 (ref 5–15)
BUN: 8 mg/dL (ref 6–20)
CO2: 25 mmol/L (ref 22–32)
Calcium: 9 mg/dL (ref 8.9–10.3)
Chloride: 105 mmol/L (ref 98–111)
Creatinine, Ser: 0.74 mg/dL (ref 0.44–1.00)
GFR, Estimated: >60 mL/min (ref >60)
Glucose, Bld: 86 mg/dL (ref 70–99)
Potassium: 3.8 mmol/L (ref 3.5–5.1)
Sodium: 140 mmol/L (ref 135–145)

## 2021-08-17 MED ORDER — DIPHENHYDRAMINE HCL 50 MG/ML IJ SOLN
25.0000 mg | Freq: Once | INTRAMUSCULAR | Status: AC
  Administered 2021-08-17: 25 mg via INTRAVENOUS
  Filled 2021-08-17: qty 1

## 2021-08-17 MED ORDER — KETOROLAC TROMETHAMINE 15 MG/ML IJ SOLN
15.0000 mg | Freq: Once | INTRAMUSCULAR | Status: AC
  Administered 2021-08-17: 15 mg via INTRAVENOUS
  Filled 2021-08-17: qty 1

## 2021-08-17 MED ORDER — ACETAMINOPHEN 325 MG PO TABS
650.0000 mg | ORAL_TABLET | Freq: Once | ORAL | Status: AC
  Administered 2021-08-17: 650 mg via ORAL
  Filled 2021-08-17: qty 2

## 2021-08-17 MED ORDER — SODIUM CHLORIDE 0.9 % IV BOLUS
1000.0000 mL | Freq: Once | INTRAVENOUS | Status: AC
Start: 2021-08-17 — End: 2021-08-17
  Administered 2021-08-17: 1000 mL via INTRAVENOUS

## 2021-08-17 MED ORDER — PROCHLORPERAZINE EDISYLATE 10 MG/2ML IJ SOLN
10.0000 mg | Freq: Once | INTRAMUSCULAR | Status: AC
  Administered 2021-08-17: 10 mg via INTRAVENOUS
  Filled 2021-08-17: qty 2

## 2021-08-17 NOTE — ED Provider Notes (Signed)
[facility] ?Provider Note ? ? ?CSN: [id number] ?Arrival date & time: 08/17/21  1154 ? ?  ? ?History ? ?Chief Complaint  ?Patient presents with  ? Headache  ? ? ?Allison Wright is a 56 y.o. female with a past medical history significant for obesity, overactive bladder who presents with left-sided headache for the last 48 hours.  She reports that the hospital she has sharp head pains that she rates 9/10.  The pain lasts for around 30 seconds at a time but has been coming more frequently.  She denies any similar headache, vision changes, hearing changes.  She denies any weakness, numbness, recent injury.  She does not do any heavy lifting, does not feel like it is muscular pain of the neck.  She has been trying some Tylenol, ibuprofen for pain with minimal relief.  No previous history of intracranial abnormality. ? ?Headache ? ?  ? ?Home Medications ?Prior to Admission medications   ?Medication Sig Start Date End Date Taking? Authorizing Provider  ?acetic acid-hydrocortisone (VOSOL-HC) OTIC solution Place 4 drops into the right ear 3 (three) times daily. 10/15/19   Maximiano Coss, NP  ?fluticasone (FLONASE) 50 MCG/ACT nasal spray Place 2 sprays into both nostrils daily. 10/15/19   Maximiano Coss, NP  ?influenza vaccine (FLUCELVAX QUADRIVALENT) 0.5 ML injection Flucelvax Quad 2019-2020 (PF) 60 mcg (15 mcg x 4)/0.5 mL IM syringe ? INJECT 0.5ML INTRAMUSCULARLY ONCE    [provider]  ?Influenza Virus Vacc Split PF 0.5 ML SUSY Afluria 2014-2015(PF) 45 mcg (15 mcg x 3)/0.5 mL intramuscular syringe ? inject 0.5 milliliter intramuscularly    [provider]  ?meclizine (ANTIVERT) 12.5 MG tablet Take 1 tablet (12.5 mg total) by mouth 3 (three) times daily as needed for dizziness. 10/15/19   Maximiano Coss, NP  ?meloxicam (MOBIC) 7.5 MG tablet TAKE 1 TABLET BY MOUTH EVERY DAY 05/13/20   Wendie Agreste, MD  ?tiZANidine (ZANAFLEX) 4 MG tablet tizanidine 4 mg  tablet    [provider]  ?tolterodine (DETROL LA) 4 MG 24 hr capsule Take 4 mg by mouth daily. 09/25/20   [provider]  ?Zoster Vaccine Adjuvanted [facility]) injection Shingrix (PF) 50 mcg/0.5 mL intramuscular suspension, kit    [provider]  ?   ? ?Allergies    ?Other   ? ?Review of Systems   ?Review of Systems  ?Neurological:  Positive for headaches.  ?All other systems reviewed and are negative. ? ?Physical Exam ?Updated Vital Signs ?BP 125/71 (BP Location: Right Arm)   Pulse 77   Temp 98.3 ?F (36.8 ?C) (Oral)   Resp 18   Ht 5' 4"  (1.626 m)   Wt 102.1 kg   SpO2 99%   BMI 38.62 kg/m?  ?Physical Exam ?Vitals and nursing note reviewed.  ?Constitutional:   ?   General: She is not in acute distress. ?   Appearance: Normal appearance.  ?HENT:  ?   Head: Normocephalic and atraumatic.  ?Eyes:  ?   General:     ?   Right eye: No discharge.     ?   Left eye: No discharge.  ?Cardiovascular:  ?   Rate and Rhythm: Normal rate and regular rhythm.  ?   Heart sounds: No murmur heard. ?  No friction rub. No gallop.  ?Pulmonary:  ?   Effort: Pulmonary effort is normal.  ?   Breath sounds: Normal breath sounds.  ?Abdominal:  ?   General: Bowel sounds  are normal.  ?   Palpations: Abdomen is soft.  ?Skin: ?   General: Skin is warm and dry.  ?   Capillary Refill: Capillary refill takes less than 2 seconds.  ?Neurological:  ?   Mental Status: She is alert and oriented to person, place, and time.  ?   Comments: Cranial nerves II through XII grossly intact.  Intact finger-nose, intact heel-to-shin.  Romberg negative, gait normal.  Alert and oriented x3.  Moves all 4 limbs spontaneously, normal coordination.  No pronator drift.  Intact strength 5 out of 5 bilateral upper and lower extremities. ? ?  ?Psychiatric:     ?   Mood and Affect: Mood normal.     ?   Behavior: Behavior normal.  ? ? ?ED Results / Procedures / Treatments   ?Labs ?(all labs ordered are listed, but only abnormal results are  displayed) ?Labs Reviewed  ?CBC WITH DIFFERENTIAL/PLATELET - Abnormal; Notable for the following components:  ?    Result Value  ? Hemoglobin 11.5 (*)   ? All other components within normal limits  ?BASIC METABOLIC PANEL  ? ? ?EKG ?None ? ?Radiology ?CT Head Wo Contrast ? ?Result Date: 08/17/2021 ?CLINICAL DATA:  Headache, new or worsening (Age >= 50y) EXAM: CT HEAD WITHOUT CONTRAST TECHNIQUE: Contiguous axial images were obtained from the base of the skull through the vertex without intravenous contrast. RADIATION DOSE REDUCTION: This exam was performed according to the departmental dose-optimization program which includes automated exposure control, adjustment of the mA and/or kV according to patient size and/or use of iterative reconstruction technique. COMPARISON:  None. FINDINGS: Brain: No evidence of large-territorial acute infarction. No parenchymal hemorrhage. No mass lesion. No extra-axial collection. No mass effect or midline shift. No hydrocephalus. Basilar cisterns are patent. Empty sella. Vascular: No hyperdense vessel. Skull: No acute fracture or focal lesion. Sinuses/Orbits: Paranasal sinuses and mastoid air cells are clear. The orbits are unremarkable. Other: None. IMPRESSION: 1. No acute intracranial abnormality. 2. Empty sella. Findings is often a normal anatomic variant but can be associated with idiopathic intracranial hypertension (pseudotumor cerebri). Electronically Signed   By: Iven Finn M.D.   On: 08/17/2021 17:56   ? ?Procedures ?Procedures  ? ? ?Medications Ordered in ED ?Medications  ?acetaminophen (TYLENOL) tablet 650 mg (650 mg Oral Given 08/17/21 1813)  ?sodium chloride 0.9 % bolus 1,000 mL (1,000 mLs Intravenous New Bag/Given 08/17/21 1814)  ?ketorolac (TORADOL) 15 MG/ML injection 15 mg (15 mg Intravenous Given 08/17/21 1813)  ?prochlorperazine (COMPAZINE) injection 10 mg (10 mg Intravenous Given 08/17/21 1813)  ?diphenhydrAMINE (BENADRYL) injection 25 mg (25 mg Intravenous Given  08/17/21 1814)  ? ? ?ED Course/ Medical Decision Making/ A&P ?  ?                        ?Medical Decision Making ?Amount and/or Complexity of Data Reviewed ?Labs: ordered. ?Radiology: ordered. ? ?Risk ?Prescription drug management. ? ? ?This patient presents to the ED for concern of left-sided occipital headache with no focal neurologic complaints, this involves an extensive number of treatment options, and is a complaint that carries with it a high risk of complications and morbidity. The emergent differential diagnosis prior to evaluation includes, but is not limited to, tension type headache, atypical migraine, acute intracranial abnormality, intracranial bleed, musculoskeletal pain of the upper trapezius versus other.  ? ?This is not an exhaustive differential.  ? ?Past Medical History / Co-morbidities / Social History: ?Obesity, prediabetes, overactive bladder ? ?  Additional history: ?External records from outside source obtained and reviewed including outpatient family medicine, orthopedics visits. ? ?Physical Exam: ?Physical exam performed. The pertinent findings include: No focal neurologic deficits.  No replicable tenderness to palpation of the posterior cervical spine or paraspinous muscles. ? ?Lab Tests: ?I ordered, and personally interpreted labs.  The pertinent results include: Very mild anemia, hemoglobin 11.5.  No leukocytosis.  BMP unremarkable. ?  ?Imaging Studies: ?I ordered imaging studies including CT head without contrast. I independently visualized and interpreted imaging which showed empty sella, no other intracranial abnormalities. I agree with the radiologist interpretation. ?  ?Medications: ?Administered 1 L fluid bolus, Compazine, Benadryl, Tylenol, Toradol.  Patient reports improvement of pain after migraine cocktail. ?  ?Disposition: ?After consideration of the diagnostic results and the patients response to treatment, I feel that patient's pain is consistent with a non-intractable  tension type headache versus migraine.  Her symptoms resolved after migraine cocktail.  Discussed she can follow-up with neurology if she has concerns regarding empty sella, discussed warning signs of idiopathic intracranial

## 2021-08-17 NOTE — ED Provider Triage Note (Signed)
Emergency Medicine Provider Triage Evaluation Note ? ?Allison Wright , a 56 y.o. female  was evaluated in triage.  Pt complains of intermittent headache.  Patient states that last night she started having a sharp "shocking" pain behind her left ear.  She took some ibuprofen with relief.  She states that the pain has gotten worse, and is more frequent.  She denies any vision or hearing changes.  She denies any pain in the ear. ? ?Review of Systems  ?Positive: Headache ?Negative: Fevers, chills, vision changes, decreased hearing ? ?Physical Exam  ?Ht 5\' 4"  (1.626 m)   Wt 102.1 kg   BMI 38.62 kg/m?  ?Gen:   Awake, no distress   ?Resp:  Normal effort  ?MSK:   Moves extremities without difficulty  ?Other:  Full passive ROM of the neck ? ?Medical Decision Making  ?Medically screening exam initiated at 12:43 PM.  Appropriate orders placed.  Allison Wright was informed that the remainder of the evaluation will be completed by another provider, this initial triage assessment does not replace that evaluation, and the importance of remaining in the ED until their evaluation is complete. ? ? ?  ?Kateri Plummer, PA-C ?08/17/21 1243 ? ?

## 2021-08-17 NOTE — ED Triage Notes (Signed)
Pt arrived POV from home c/o of sharp pains that come and go right behind her left ear. Pt states the pain has gotten worse since last night and more frequent. Denies any vision changes and hearing changes.  ?

## 2021-08-17 NOTE — Discharge Instructions (Signed)
As we discussed I am suspicious of tension type headache versus musculoskeletal pain causing the sharp sensation that you are describing.  The CT abnormality which is likely benign that I discussed with you is called an "empty sella" and can be seen in a condition called idiopathic intracranial hypertension which I do not think agrees with the symptoms that you have been having.  Nonetheless if you continue to have headaches, especially if you begin to have vision changes, elevated blood pressure, you may want to consider this diagnosis, and return to the emergency department.  I have placed the name of a neurologist that you can follow-up with if you continue to have these headaches.  Otherwise you can try over-the-counter pain control. ? ?Please use Tylenol or ibuprofen for pain.  You may use 600 mg ibuprofen every 6 hours or 1000 mg of Tylenol every 6 hours.  You may choose to alternate between the 2.  This would be most effective.  Not to exceed 4 g of Tylenol within 24 hours.  Not to exceed 3200 mg ibuprofen 24 hours. ? ?It was a pleasure taking care of you today, I am glad that you are feeling better.  Please do not hesitate to return if you have any concerns. ?

## 2021-08-30 NOTE — Progress Notes (Signed)
? ?Referring:  ?Camillia Herter, NP ?[address] ?[address] ?[place],  [place] [address] ? ?PCP: ?Camillia Herter, NP ? ?Neurology was asked to evaluate Allison Wright, a 56 year old female for a chief complaint of headaches.  Our recommendations of care will be communicated by shared medical record.   ? ?CC:  headaches ? ?History provided from self ? ?HPI:  ?Medical co-morbidities: overactive bladder ? ?The patient presents for evaluation of headaches. On 08/17/21 she developed pain in her left occiput. Pain was intermittent so she did not seek treatment that day. The next day the headache began to worsen. It felt like throbbing and shocking pain in her left occiput which radiated behind her ear. Headache was not associated with photophobia, phonophobia, or nausea. She presented to the ED where [facility] showed an empty sella and was otherwise unremarkable. She was given Toradol which did help. Her headache lasted for 2 days then resolved. She has not had any more headaches since this episode. Denies positional headaches, vision changes, or pulsatile tinnitus. ? ?She does have a history of intermittent mild headaches. She has never been diagnosed with migraines. ? ?She has not been to an eye doctor in several years. ? ?Headache History: ?Onset: March 2022 ?Triggers: no ?Aura: no ?Location: left occiput ?Quality/Description: sharp, throbbing ?Associated Symptoms: ? Photophobia: no ? Phonophobia: no ? Nausea: no ?Worse with activity?: yes ?Duration of headaches: 48 hours ? ?Headache days per month: 2 ?Headache free days per month: 28 ? ?Current Treatment: ?Abortive ?none ? ?Preventative ?none ? ?Prior Therapies                                 ?Toradol ? ? ? ?LABS: ? ?  Latest Ref Rng & Units 08/17/2021  ?  6:28 PM 11/01/2020  ?  3:53 PM 10/15/2019  ?  8:52 AM  ?BMP  ?Glucose 70 - 99 mg/dL 86   81   105    ?BUN 6 - 20 mg/dL 8   14   9     ?Creatinine 0.44 - 1.00 mg/dL 0.74   0.74   0.77    ?BUN/Creat Ratio 9 - 23  19   12      ?Sodium 135 - 145 mmol/L 140   142   141    ?Potassium 3.5 - 5.1 mmol/L 3.8   4.8   4.5    ?Chloride 98 - 111 mmol/L 105   103   101    ?CO2 22 - 32 mmol/L 25   22   24     ?Calcium 8.9 - 10.3 mg/dL 9.0   9.6   9.5    ? ?CBC ?   ?Component Value Date/Time  ? WBC 10.1 08/17/2021 1828  ? RBC 3.95 08/17/2021 1828  ? HGB 11.5 (L) 08/17/2021 1828  ? HGB 12.6 11/01/2020 1553  ? HCT 36.2 08/17/2021 1828  ? HCT 40.1 11/01/2020 1553  ? PLT 329 08/17/2021 1828  ? PLT 372 11/01/2020 1553  ? MCV 91.6 08/17/2021 1828  ? MCV 90 11/01/2020 1553  ? MCH 29.1 08/17/2021 1828  ? MCHC 31.8 08/17/2021 1828  ? RDW 14.6 08/17/2021 1828  ? RDW 14.5 11/01/2020 1553  ? LYMPHSABS 3.5 08/17/2021 1828  ? MONOABS 0.7 08/17/2021 1828  ? EOSABS 0.2 08/17/2021 1828  ? BASOSABS 0.0 08/17/2021 1828  ? ? ? ? ?IMAGING:  ?[place] 08/17/21: empty sella, otherwise no acute intracranial  abnormality ? ?Imaging independently reviewed on August 31, 2021  ? ?Current Outpatient Medications on File Prior to Visit  ?Medication Sig Dispense Refill  ? acetic acid-hydrocortisone (VOSOL-HC) OTIC solution Place 4 drops into the right ear 3 (three) times daily. 10 mL 0  ? diclofenac Sodium (VOLTAREN) 1 % GEL Apply topically 4 (four) times daily.    ? fluticasone (FLONASE) 50 MCG/ACT nasal spray Place 2 sprays into both nostrils daily. 16 g 6  ? meclizine (ANTIVERT) 12.5 MG tablet Take 1 tablet (12.5 mg total) by mouth 3 (three) times daily as needed for dizziness. 30 tablet 0  ? meloxicam (MOBIC) 7.5 MG tablet TAKE 1 TABLET BY MOUTH EVERY DAY 20 tablet 0  ? tolterodine (DETROL LA) 4 MG 24 hr capsule Take 4 mg by mouth daily.    ? ?No current facility-administered medications on file prior to visit.  ? ? ? ?Allergies: ?Allergies  ?Allergen Reactions  ? Other   ?  Seasonal  ? ? ?Family History: ?Migraine or other headaches in the family:  no ?Aneurysms in a first degree relative:  no ?Brain tumors in the family:  no ?Other neurological illness in the family:   no ? ?Past  Medical History: ?Past Medical History:  ?Diagnosis Date  ? Allergy   ? Arthritis   ? Heart murmur   ? Post-menopause   ? Vertigo   ? ? ?Past Surgical History ?Past Surgical History:  ?Procedure Laterality Date  ? ABDOMINAL HYSTERECTOMY  2001  ? Retained cervix  ? ? ?Social History: ?Social History  ? ?Tobacco Use  ? Smoking status: Former  ?  Packs/day: 0.50  ?  Years: 10.00  ?  Pack years: 5.00  ?  Types: Cigarettes  ?  Quit date: 05/29/1996  ?  Years since quitting: 25.2  ? Smokeless tobacco: Never  ?Substance Use Topics  ? Alcohol use: No  ?  Alcohol/week: 0.0 standard drinks  ? Drug use: No  ? ? ?ROS: ?Negative for fevers, chills. Positive for headaches. All other systems reviewed and negative unless stated otherwise in HPI. ? ? ?Physical Exam:  ? ?Vital Signs: ?BP 112/64   Pulse 60   Ht 5\' 4"  (1.626 m)   Wt 231 lb (104.8 kg)   SpO2 99%   BMI 39.65 kg/m?  ?GENERAL: well appearing,in no acute distress,alert ?SKIN:  Color, texture, turgor normal. No rashes or lesions ?HEAD:  Normocephalic/atraumatic. ?CV:  RRR ?RESP: Normal respiratory effort ?MSK: +tenderness to palpation over right shoulder (hx of bone spur) ? ?NEUROLOGICAL: ?Mental Status: Alert, oriented to person, place and time,Follows commands ?Cranial Nerves: PERRL, no papilledema visualized, visual fields intact to confrontation, extraocular movements intact, facial sensation intact, no facial droop or ptosis, hearing grossly intact, no dysarthria ?Motor: muscle strength 5/5 both upper and lower extremities ?Reflexes: 2+ throughout ?Sensation: intact to light touch all 4 extremities ?Coordination: Finger-to- nose-finger intact bilaterally ?Gait: normal-based ? ? ?IMPRESSION: ?56 year old female with a history of overactive bladder who presents for evaluation of headaches and empty sella on CTH. Suspect this is an incidental finding as her headache pattern is not typical of IIH. No papilledema visualized on direct fundoscopy today, however given empty  sella will refer to ophthalmology for a formal eye exam. If this is normal, suspect her headache was secondary to left occipital neuralgia. Will defer treatment for now as she has not had a recurrence of headache. Discussed neck PT as a potential next step if headache reoccurs. ? ?PLAN: ?-  Referral to Ophthalmology for formal fundus exam ?-next steps: consider neck PT if headache returns and no papilledema seen on ophtho exam ? ?I spent a total of 25 minutes chart reviewing and counseling the patient. Headache education was done. Discussed treatment options including physical therapy.  Written educational materials and patient instructions outlining all of the above were given. ? ?Follow-up: as needed ? ? ?Genia Harold, MD ?08/31/2021   ?9:53 AM ? ? ?

## 2021-08-31 ENCOUNTER — Encounter: Payer: Self-pay | Admitting: Psychiatry

## 2021-08-31 ENCOUNTER — Ambulatory Visit: Payer: Federal, State, Local not specified - PPO | Admitting: Psychiatry

## 2021-08-31 VITALS — BP 112/64 | HR 60 | Ht 64.0 in | Wt 231.0 lb

## 2021-08-31 DIAGNOSIS — E236 Other disorders of pituitary gland: Secondary | ICD-10-CM

## 2021-08-31 NOTE — Patient Instructions (Signed)
Referral to Ophthalmology to look for swelling in the back of your eyes ?

## 2021-09-01 ENCOUNTER — Telehealth: Payer: Self-pay | Admitting: Psychiatry

## 2021-09-01 NOTE — Telephone Encounter (Signed)
Sent to Dr. Groat ph # [phone] 

## 2021-09-12 DIAGNOSIS — R519 Headache, unspecified: Secondary | ICD-10-CM | POA: Diagnosis not present

## 2021-09-12 DIAGNOSIS — H25813 Combined forms of age-related cataract, bilateral: Secondary | ICD-10-CM | POA: Diagnosis not present

## 2021-09-14 DIAGNOSIS — Z86010 Personal history of colonic polyps: Secondary | ICD-10-CM | POA: Diagnosis not present

## 2021-09-14 DIAGNOSIS — K649 Unspecified hemorrhoids: Secondary | ICD-10-CM | POA: Diagnosis not present

## 2021-09-14 DIAGNOSIS — D123 Benign neoplasm of transverse colon: Secondary | ICD-10-CM | POA: Diagnosis not present

## 2021-09-21 DIAGNOSIS — R350 Frequency of micturition: Secondary | ICD-10-CM | POA: Diagnosis not present

## 2021-09-21 DIAGNOSIS — R351 Nocturia: Secondary | ICD-10-CM | POA: Diagnosis not present

## 2022-01-24 DIAGNOSIS — Z711 Person with feared health complaint in whom no diagnosis is made: Secondary | ICD-10-CM | POA: Diagnosis not present

## 2022-01-24 DIAGNOSIS — Z6838 Body mass index (BMI) 38.0-38.9, adult: Secondary | ICD-10-CM | POA: Diagnosis not present

## 2022-01-24 DIAGNOSIS — Z03818 Encounter for observation for suspected exposure to other biological agents ruled out: Secondary | ICD-10-CM | POA: Diagnosis not present

## 2022-01-24 DIAGNOSIS — Z20822 Contact with and (suspected) exposure to covid-19: Secondary | ICD-10-CM | POA: Diagnosis not present

## 2022-01-24 DIAGNOSIS — Z23 Encounter for immunization: Secondary | ICD-10-CM | POA: Diagnosis not present

## 2022-02-02 DIAGNOSIS — Z6838 Body mass index (BMI) 38.0-38.9, adult: Secondary | ICD-10-CM | POA: Diagnosis not present

## 2022-02-02 DIAGNOSIS — U071 COVID-19: Secondary | ICD-10-CM | POA: Diagnosis not present

## 2022-02-02 DIAGNOSIS — J398 Other specified diseases of upper respiratory tract: Secondary | ICD-10-CM | POA: Diagnosis not present

## 2022-03-13 DIAGNOSIS — Z1231 Encounter for screening mammogram for malignant neoplasm of breast: Secondary | ICD-10-CM | POA: Diagnosis not present

## 2022-03-13 DIAGNOSIS — Z01419 Encounter for gynecological examination (general) (routine) without abnormal findings: Secondary | ICD-10-CM | POA: Diagnosis not present

## 2022-06-05 ENCOUNTER — Ambulatory Visit (INDEPENDENT_AMBULATORY_CARE_PROVIDER_SITE_OTHER): Payer: Federal, State, Local not specified - PPO | Admitting: Family

## 2022-06-05 ENCOUNTER — Encounter: Payer: Self-pay | Admitting: Family

## 2022-06-05 VITALS — BP 129/78 | HR 73 | Temp 98.3°F | Resp 16 | Ht 64.02 in | Wt 225.0 lb

## 2022-06-05 DIAGNOSIS — R7303 Prediabetes: Secondary | ICD-10-CM | POA: Diagnosis not present

## 2022-06-05 DIAGNOSIS — Z0000 Encounter for general adult medical examination without abnormal findings: Secondary | ICD-10-CM

## 2022-06-05 DIAGNOSIS — I498 Other specified cardiac arrhythmias: Secondary | ICD-10-CM | POA: Diagnosis not present

## 2022-06-05 DIAGNOSIS — Z1329 Encounter for screening for other suspected endocrine disorder: Secondary | ICD-10-CM

## 2022-06-05 DIAGNOSIS — Z0001 Encounter for general adult medical examination with abnormal findings: Secondary | ICD-10-CM | POA: Diagnosis not present

## 2022-06-05 DIAGNOSIS — Z8249 Family history of ischemic heart disease and other diseases of the circulatory system: Secondary | ICD-10-CM

## 2022-06-05 DIAGNOSIS — Z13220 Encounter for screening for lipoid disorders: Secondary | ICD-10-CM | POA: Diagnosis not present

## 2022-06-05 DIAGNOSIS — Z130 Encounter for screening for diseases of the blood and blood-forming organs and certain disorders involving the immune mechanism: Secondary | ICD-10-CM | POA: Diagnosis not present

## 2022-06-05 DIAGNOSIS — R011 Cardiac murmur, unspecified: Secondary | ICD-10-CM

## 2022-06-05 DIAGNOSIS — Z13228 Encounter for screening for other metabolic disorders: Secondary | ICD-10-CM

## 2022-06-05 NOTE — Progress Notes (Signed)
Patient ID: Allison Wright, female    DOB: 04/22/1966  MRN: [id number]  CC: Annual Physical Exam  Subjective: [name] is a 57 y.o. female who presents for annual physical exam.   Her concerns today include:  - Concern for heart flutters during bedtime. Occurring nightly. States "doesn't last long". She denies any associated red flag symptoms. Reports personal history of heart murmur. Reports her father had congestive heart failure.  - Reports she is up to date on women's health maintenance mammogram and pap.   Patient Active Problem List   Diagnosis Date Noted   Overactive bladder 01/28/2020   Dizziness 01/30/2018   Stress fracture of left calcaneus 08/28/2017   Prediabetes 10/12/2016   Overweight 28/36/6294   Systolic ejection murmur 76/54/6503     Current Outpatient Medications on File Prior to Visit  Medication Sig Dispense Refill   diclofenac Sodium (VOLTAREN) 1 % GEL Apply topically 4 (four) times daily.     meloxicam (MOBIC) 7.5 MG tablet TAKE 1 TABLET BY MOUTH EVERY DAY 20 tablet 0   tolterodine (DETROL LA) 4 MG 24 hr capsule Take 4 mg by mouth daily.     No current facility-administered medications on file prior to visit.    Allergies  Allergen Reactions   Other     Seasonal    Social History   Socioeconomic History   Marital status: Single    Spouse name: Not on file   Number of children: Not on file   Years of education: Not on file   Highest education level: Not on file  Occupational History   Not on file  Tobacco Use   Smoking status: Former    Packs/day: 0.50    Years: 10.00    Total pack years: 5.00    Types: Cigarettes    Quit date: 05/29/1996    Years since quitting: 26.0    Passive exposure: Past   Smokeless tobacco: Never  Substance and Sexual Activity   Alcohol use: No    Alcohol/week: 0.0 standard drinks of alcohol   Drug use: No   Sexual activity: Never  Other Topics Concern   Not on file  Social History Narrative   Right  handed   Caffeine-rarely usese    Social Determinants of Health   Financial Resource Strain: Not on file  Food Insecurity: Not on file  Transportation Needs: Not on file  Physical Activity: Not on file  Stress: Not on file  Social Connections: Not on file  Intimate Partner Violence: Not on file    Family History  Problem Relation Age of Onset   Cirrhosis Mother    Alcohol abuse Mother    Diabetes Father 35   Heart attack Father    Heart disease Paternal Grandmother     Past Surgical History:  Procedure Laterality Date   ABDOMINAL HYSTERECTOMY  2001   Retained cervix    ROS: Review of Systems Negative except as stated above  PHYSICAL EXAM: BP 129/78   Pulse 73   Temp 98.3 F (36.8 C)   Resp 16   Ht 5' 4.02" (1.626 m)   Wt 225 lb (102.1 kg)   SpO2 98%   BMI 38.60 kg/m   Physical Exam HENT:     Head: Normocephalic and atraumatic.     Right Ear: Tympanic membrane, ear canal and external ear normal.     Left Ear: Tympanic membrane, ear canal and external ear normal.     Nose: Nose normal.  Mouth/Throat:     Mouth: Mucous membranes are moist.     Pharynx: Oropharynx is clear.  Eyes:     Extraocular Movements: Extraocular movements intact.     Conjunctiva/sclera: Conjunctivae normal.     Pupils: Pupils are equal, round, and reactive to light.  Cardiovascular:     Rate and Rhythm: Normal rate and regular rhythm.     Pulses: Normal pulses.     Heart sounds: Normal heart sounds.  Pulmonary:     Effort: Pulmonary effort is normal.     Breath sounds: Normal breath sounds.  Chest:     Comments: Patient declined.  Abdominal:     General: Bowel sounds are normal.     Palpations: Abdomen is soft.  Genitourinary:    Comments: Patient declined.  Musculoskeletal:        General: Normal range of motion.     Right shoulder: Normal.     Left shoulder: Normal.     Right upper arm: Normal.     Left upper arm: Normal.     Right elbow: Normal.     Left elbow:  Normal.     Right forearm: Normal.     Left forearm: Normal.     Right wrist: Normal.     Left wrist: Normal.     Right hand: Normal.     Left hand: Normal.     Cervical back: Normal, normal range of motion and neck supple.     Thoracic back: Normal.     Lumbar back: Normal.     Right hip: Normal.     Left hip: Normal.     Right upper leg: Normal.     Left upper leg: Normal.     Right knee: Normal.     Left knee: Normal.     Right lower leg: Normal.     Left lower leg: Normal.     Right ankle: Normal.     Left ankle: Normal.     Right foot: Normal.     Left foot: Normal.  Skin:    General: Skin is warm and dry.     Capillary Refill: Capillary refill takes less than 2 seconds.  Neurological:     General: No focal deficit present.     Mental Status: She is alert and oriented to person, place, and time.  Psychiatric:        Mood and Affect: Mood normal.        Behavior: Behavior normal.     ASSESSMENT AND PLAN: 1. Annual physical exam - Counseled on 150 minutes of exercise per week as tolerated, healthy eating (including decreased daily intake of saturated fats, cholesterol, added sugars, sodium), STI prevention, and routine healthcare maintenance.  2. Screening for metabolic disorder - Routine screening.  - CMP14+EGFR  3. Screening for deficiency anemia - Routine screening.  - CBC  4. Prediabetes - Routine screening.  - Hemoglobin A1c  5. Screening cholesterol level - Routine screening.  - Lipid panel  6. Thyroid disorder screen - Routine screening.  - TSH  7. Fluttering heart 8. Murmur, heart 9. Family history of congestive heart failure - Patient today in office with no cardiopulmonary distress.  - Referral to Cardiology for further evaluation and management.  - Ambulatory referral to Cardiology   Patient was given the opportunity to ask questions.  Patient verbalized understanding of the plan and was able to repeat key elements of the plan. Patient  was given clear instructions to go to  Emergency Department or return to medical center if symptoms don't improve, worsen, or new problems develop.The patient verbalized understanding.   Orders Placed This Encounter  Procedures   CBC   Lipid panel   TSH   CMP14+EGFR   Hemoglobin A1c   Ambulatory referral to Cardiology     Return in about 1 year (around 06/06/2023) for Physical per patient preference.  Rema Fendt, NP

## 2022-06-05 NOTE — Progress Notes (Signed)
.  Pt presents for annual physical exam   -at bedtime pt states she is experiencing flutters when resting

## 2022-06-05 NOTE — Patient Instructions (Signed)

## 2022-06-06 ENCOUNTER — Other Ambulatory Visit: Payer: Self-pay | Admitting: Family

## 2022-06-06 DIAGNOSIS — E785 Hyperlipidemia, unspecified: Secondary | ICD-10-CM | POA: Insufficient documentation

## 2022-06-06 LAB — LIPID PANEL
Chol/HDL Ratio: 3.9 ratio (ref 0.0–4.4)
Cholesterol, Total: 271 mg/dL — ABNORMAL HIGH (ref 100–199)
HDL: 70 mg/dL
LDL Chol Calc (NIH): 175 mg/dL — ABNORMAL HIGH (ref 0–99)
Triglycerides: 147 mg/dL (ref 0–149)
VLDL Cholesterol Cal: 26 mg/dL (ref 5–40)

## 2022-06-06 LAB — CMP14+EGFR
ALT: 21 IU/L (ref 0–32)
AST: 21 IU/L (ref 0–40)
Albumin/Globulin Ratio: 1.3 (ref 1.2–2.2)
Albumin: 4.3 g/dL (ref 3.8–4.9)
Alkaline Phosphatase: 96 IU/L (ref 44–121)
BUN/Creatinine Ratio: 19 (ref 9–23)
BUN: 12 mg/dL (ref 6–24)
Bilirubin Total: <0.2 mg/dL (ref 0.0–1.2)
CO2: 24 mmol/L (ref 20–29)
Calcium: 9.5 mg/dL (ref 8.7–10.2)
Chloride: 102 mmol/L (ref 96–106)
Creatinine, Ser: 0.63 mg/dL (ref 0.57–1.00)
Globulin, Total: 3.2 g/dL (ref 1.5–4.5)
Glucose: 93 mg/dL (ref 70–99)
Potassium: 4.5 mmol/L (ref 3.5–5.2)
Sodium: 141 mmol/L (ref 134–144)
Total Protein: 7.5 g/dL (ref 6.0–8.5)
eGFR: 103 mL/min/{1.73_m2} (ref >59)

## 2022-06-06 LAB — CBC
Hematocrit: 38.2 % (ref 34.0–46.6)
Hemoglobin: 12.1 g/dL (ref 11.1–15.9)
MCH: 27.8 pg (ref 26.6–33.0)
MCHC: 31.7 g/dL (ref 31.5–35.7)
MCV: 88 fL (ref 79–97)
Platelets: 366 10*3/uL (ref 150–450)
RBC: 4.35 x10E6/uL (ref 3.77–5.28)
RDW: 14.5 % (ref 11.7–15.4)
WBC: 9.1 10*3/uL (ref 3.4–10.8)

## 2022-06-06 LAB — HEMOGLOBIN A1C
Est. average glucose Bld gHb Est-mCnc: 128 mg/dL
Hgb A1c MFr Bld: 6.1 % — ABNORMAL HIGH (ref 4.8–5.6)

## 2022-06-06 LAB — TSH: TSH: 1.36 u[IU]/mL (ref 0.450–4.500)

## 2022-06-06 MED ORDER — ATORVASTATIN CALCIUM 20 MG PO TABS: 20.0000 mg | ORAL_TABLET | Freq: Every day | ORAL | 2 refills | Status: DC

## 2022-06-29 ENCOUNTER — Encounter: Payer: Self-pay | Admitting: Internal Medicine

## 2022-06-29 ENCOUNTER — Ambulatory Visit: Payer: Federal, State, Local not specified - PPO | Attending: Internal Medicine | Admitting: Internal Medicine

## 2022-06-29 VITALS — BP 122/76 | HR 65 | Ht 64.0 in | Wt 229.6 lb

## 2022-06-29 DIAGNOSIS — Z79899 Other long term (current) drug therapy: Secondary | ICD-10-CM | POA: Diagnosis not present

## 2022-06-29 DIAGNOSIS — E785 Hyperlipidemia, unspecified: Secondary | ICD-10-CM

## 2022-06-29 MED ORDER — ATORVASTATIN CALCIUM 20 MG PO TABS: 20.0000 mg | ORAL_TABLET | Freq: Every day | ORAL | 3 refills | Status: DC

## 2022-06-29 NOTE — Progress Notes (Signed)
Cardiology Office Note   Date:  06/29/2022   ID:  Allison Wright, DOB 10-16-1965, MRN [id number]  PCP:  Camillia Herter, NP  Cardiologist:   Dorris Carnes, MD   Patient referred by Marvell Fuller for palpitations     History of Present Illness: Allison Wright is a 57 y.o. female with a history of a heart murmur.  Seen by Marvell Fuller NP  in Jan 2024  Complained of fluttering in chest.  Occurred nightly  Started early Jan  Episodes last a  few minutes   Since that appt the pt says episodes are less frequent  Now a couple times per week When not having spells she feels good   No CP  no SOB   No dizziness Walks 10,000 steps per day   Uses treadmill and elliptical     Duet Breakfast  Protein shake  Protein mix and fresh fruit Lunch    Leftovers   Meat, veggies, salad 3x per week Diner    Sandwich, cereal, popcorn Drinks:  Water  Snack   Chips        Current Meds  Medication Sig   atorvastatin (LIPITOR) 20 MG tablet Take 1 tablet (20 mg total) by mouth daily.   diclofenac Sodium (VOLTAREN) 1 % GEL Apply topically 4 (four) times daily.   ibuprofen (ADVIL) 600 MG tablet Take 600 mg by mouth every 6 (six) hours as needed for mild pain or moderate pain.   meloxicam (MOBIC) 7.5 MG tablet TAKE 1 TABLET BY MOUTH EVERY DAY   tolterodine (DETROL LA) 4 MG 24 hr capsule Take 4 mg by mouth daily.     Allergies:   Other   Past Medical History:  Diagnosis Date   Allergy    Arthritis    Heart murmur    Post-menopause    Vertigo     Past Surgical History:  Procedure Laterality Date   ABDOMINAL HYSTERECTOMY  2001   Retained cervix     Social History:  The patient  reports that she quit smoking about 26 years ago. Her smoking use included cigarettes. She has a 5.00 pack-year smoking history. She has been exposed to tobacco smoke. She has never used smokeless tobacco. She reports that she does not drink alcohol and does not use drugs.   Family History:  The patient's family history includes  Alcohol abuse in her mother; Cirrhosis in her mother; Diabetes (age of onset: 104) in her father; Heart attack in her father; Heart disease in her paternal grandmother.    ROS:  Please see the history of present illness. All other systems are reviewed and  Negative to the above problem except as noted.    PHYSICAL EXAM: VS:  BP 122/76   Pulse 65   Ht 5\' 4"  (1.626 m)   Wt 229 lb 9.6 oz (104.1 kg)   SpO2 97%   BMI 39.41 kg/m   GEN: Well nourished, well developed, in no acute distress  HEENT: normal  Neck: no JVD, carotid bruit Cardiac: RRR; Gr II/VI sytolic murmur base   normal S2    no LE edema  good pulses   Respiratory:  clear to auscultation bilaterally,  GI: soft, nontender, nondistended, + BS  No hepatomegaly  MS: no deformity Moving all extremities   Skin: warm and dry, no rash Neuro:  Strength and sensation are intact Psych: euthymic mood, full affect   EKG:  EKG is ordered today.  SR 65 bpm  Nonspecific ST changes    Lipid Panel    Component Value Date/Time   CHOL 271 (H) 06/05/2022 1507   TRIG 147 06/05/2022 1507   HDL 70 06/05/2022 1507   CHOLHDL 3.9 06/05/2022 1507   LDLCALC 175 (H) 06/05/2022 1507      Wt Readings from Last 3 Encounters:  06/29/22 229 lb 9.6 oz (104.1 kg)  06/05/22 225 lb (102.1 kg)  08/31/21 231 lb (104.8 kg)      ASSESSMENT AND PLAN:  1  Palpitations   Episodes are improving   Less frequent  Overall short lived  Do not sound hemodynamically significant.    Follow   If they do not go away in the next few wks would get 2 wk monitor    Stay hydrated  2  HL  Profound   FHX of CAD    Agree with lipitor Rx   Check lipomed and Lpa and ApoB in 8 wk with liver panel  3  Murmur   Normal S1, S2  Murmur may relfect flow through LVOT  Follow for now   No testing     4    Metabolics   K9F 6.1   Discussed diet   Follow     Stay active   I will follow up with labs    Follow up in clinic next winter    Current medicines are reviewed at length  with the patient today.  The patient does not have concerns regarding medicines.  Signed, Dorris Carnes, MD  06/29/2022 4:03 PM    [facility] [address], [place], [place]  [address] Phone: [phone]; Fax: [phone]

## 2022-06-29 NOTE — Patient Instructions (Signed)
Medication Instructions:  START LIPITOR 20 MG A DAY  *If you need a refill on your cardiac medications before your next appointment, please call your pharmacy*   Lab Work: NMR, APO B, LIPO A, HEPATIC IN 8 WEEKS FASTING  If you have labs (blood work) drawn today and your tests are completely normal, you will receive your results only by: [place] (if you have MyChart) OR A paper copy in the mail If you have any lab test that is abnormal or we need to change your treatment, we will call you to review the results.   Testing/Procedures:    Follow-Up: At [facility], you and your health needs are our priority.  As part of our continuing mission to provide you with exceptional heart care, we have created designated Provider Care Teams.  These Care Teams include your primary Cardiologist (physician) and Advanced Practice Providers (APPs -  Physician Assistants and Nurse Practitioners) who all work together to provide you with the care you need, when you need it.  We recommend signing up for the patient portal called "MyChart".  Sign up information is provided on this After Visit Summary.  MyChart is used to connect with patients for Virtual Visits (Telemedicine).  Patients are able to view lab/test results, encounter notes, upcoming appointments, etc.  Non-urgent messages can be sent to your provider as well.   To learn more about what you can do with MyChart, go to [email or web address].    Your next appointment:   1 year(s)  Provider:   DR Dorris Carnes    Other Instructions

## 2022-08-24 ENCOUNTER — Ambulatory Visit: Payer: Federal, State, Local not specified - PPO | Attending: Internal Medicine

## 2022-08-24 DIAGNOSIS — Z79899 Other long term (current) drug therapy: Secondary | ICD-10-CM

## 2022-08-24 DIAGNOSIS — E785 Hyperlipidemia, unspecified: Secondary | ICD-10-CM

## 2022-08-25 LAB — NMR, LIPOPROFILE
Cholesterol, Total: 187 mg/dL (ref 100–199)
HDL Particle Number: 40.4 umol/L (ref >=30.5)
HDL-C: 68 mg/dL (ref >39)
LDL Particle Number: 1714 nmol/L — ABNORMAL HIGH (ref <1000)
LDL Size: 20.6 nm (ref >20.5)
LDL-C (NIH Calc): 104 mg/dL — ABNORMAL HIGH (ref 0–99)
LP-IR Score: 64 — ABNORMAL HIGH (ref <=45)
Small LDL Particle Number: 886 nmol/L — ABNORMAL HIGH (ref <=527)
Triglycerides: 85 mg/dL (ref 0–149)

## 2022-08-25 LAB — HEPATIC FUNCTION PANEL
ALT: 19 IU/L (ref 0–32)
AST: 14 IU/L (ref 0–40)
Albumin: 4.3 g/dL (ref 3.8–4.9)
Alkaline Phosphatase: 110 IU/L (ref 44–121)
Bilirubin Total: <0.2 mg/dL (ref 0.0–1.2)
Bilirubin, Direct: <0.1 mg/dL (ref 0.00–0.40)
Total Protein: 7.1 g/dL (ref 6.0–8.5)

## 2022-08-25 LAB — LIPOPROTEIN A (LPA): Lipoprotein (a): 186.7 nmol/L — ABNORMAL HIGH (ref <75.0)

## 2022-08-25 LAB — APOLIPOPROTEIN B: Apolipoprotein B: 95 mg/dL — ABNORMAL HIGH (ref <90)

## 2022-08-29 ENCOUNTER — Telehealth: Payer: Self-pay

## 2022-08-29 DIAGNOSIS — R7303 Prediabetes: Secondary | ICD-10-CM

## 2022-08-29 DIAGNOSIS — E785 Hyperlipidemia, unspecified: Secondary | ICD-10-CM

## 2022-08-29 DIAGNOSIS — Z7189 Other specified counseling: Secondary | ICD-10-CM

## 2022-08-29 NOTE — Telephone Encounter (Signed)
Left a message for the pt to call back.  

## 2022-08-29 NOTE — Telephone Encounter (Signed)
-----   Message from [place], MD sent at 08/28/2022  2:06 PM EDT ----- LDL is 104    Lpa and Apo B are both elevated (proteins that carry LDL particle)   Park as increased risk for CAD  Pt has no imaging to show CAD  Would suggest a noncontrast CT of chest to evaluate for aortic/coronary atherosclerosis.  FHx  CAD  ? Coverage/copay?  Please confirm

## 2022-08-30 NOTE — Telephone Encounter (Signed)
Patient returned RN's call. 

## 2022-08-30 NOTE — Telephone Encounter (Signed)
Spoke w patient and reviewed results and recommendations.  She only knows that her father died of congestive heart failure at age  58.  She is willing to have chest CT non contrast.    Order placed.

## 2022-09-05 NOTE — Telephone Encounter (Signed)
  Good afternoon I can not do an estimate until this is scheduled.  It also depends on the patient's deductible and if this has been met. Thank You Allison Wright a message to the [facility] to let me know when her CT is scheduled so I can reach back out to billing.

## 2022-09-08 NOTE — Telephone Encounter (Signed)
My Chart message sent to the pt from Billing of her CT.

## 2022-09-08 NOTE — Telephone Encounter (Signed)
Kristia, [place]ratt - 08/29/2022  5:37 PM Maryan Puls  Sent: Fri September 08, 2022  9:48 AM  To: Loa Socks, LPN         Message  Good morning  Per the estimate no patient responsibility.  Thank You  Alice "Joyce Gross" Yetta Barre

## 2022-09-19 DIAGNOSIS — R519 Headache, unspecified: Secondary | ICD-10-CM | POA: Diagnosis not present

## 2022-09-19 DIAGNOSIS — H25813 Combined forms of age-related cataract, bilateral: Secondary | ICD-10-CM | POA: Diagnosis not present

## 2022-09-22 DIAGNOSIS — R351 Nocturia: Secondary | ICD-10-CM | POA: Diagnosis not present

## 2022-09-22 DIAGNOSIS — N3941 Urge incontinence: Secondary | ICD-10-CM | POA: Diagnosis not present

## 2022-09-26 ENCOUNTER — Ambulatory Visit (HOSPITAL_COMMUNITY)
Admission: RE | Admit: 2022-09-26 | Discharge: 2022-09-26 | Disposition: A | Discharge status: Home / Self Care | Payer: Federal, State, Local not specified - PPO | Source: Ambulatory Visit | Attending: Internal Medicine | Admitting: Internal Medicine

## 2022-09-26 DIAGNOSIS — R7303 Prediabetes: Secondary | ICD-10-CM | POA: Insufficient documentation

## 2022-09-26 DIAGNOSIS — E785 Hyperlipidemia, unspecified: Secondary | ICD-10-CM | POA: Insufficient documentation

## 2022-09-26 DIAGNOSIS — Z7189 Other specified counseling: Secondary | ICD-10-CM | POA: Diagnosis not present

## 2022-09-26 DIAGNOSIS — I700 Atherosclerosis of aorta: Secondary | ICD-10-CM | POA: Diagnosis not present

## 2022-10-02 ENCOUNTER — Other Ambulatory Visit: Payer: Self-pay

## 2022-10-02 DIAGNOSIS — E785 Hyperlipidemia, unspecified: Secondary | ICD-10-CM

## 2022-10-02 MED ORDER — ATORVASTATIN CALCIUM 40 MG PO TABS: 40.0000 mg | ORAL_TABLET | Freq: Every day | ORAL | 3 refills | Status: DC

## 2022-10-04 DIAGNOSIS — Z79899 Other long term (current) drug therapy: Secondary | ICD-10-CM

## 2022-10-04 DIAGNOSIS — E785 Hyperlipidemia, unspecified: Secondary | ICD-10-CM

## 2022-10-05 NOTE — Telephone Encounter (Signed)
I spoke with the pt and she verbalized understanding of her lab results and will increase her Lipitor to 40 mg and have a repeat NMR 12/19/22.

## 2022-10-05 NOTE — Telephone Encounter (Signed)
Left a message for the pt to call back.  

## 2022-12-19 ENCOUNTER — Ambulatory Visit: Payer: Federal, State, Local not specified - PPO | Attending: Internal Medicine

## 2022-12-19 ENCOUNTER — Encounter: Payer: Self-pay | Admitting: Cardiovascular Disease

## 2022-12-19 ENCOUNTER — Encounter: Payer: Self-pay | Admitting: Interventional Cardiology

## 2022-12-19 DIAGNOSIS — Z79899 Other long term (current) drug therapy: Secondary | ICD-10-CM

## 2022-12-19 DIAGNOSIS — E785 Hyperlipidemia, unspecified: Secondary | ICD-10-CM | POA: Diagnosis not present

## 2022-12-20 LAB — NMR, LIPOPROFILE
Cholesterol, Total: 211 mg/dL — ABNORMAL HIGH (ref 100–199)
HDL Particle Number: 38.7 umol/L (ref >=30.5)
HDL-C: 66 mg/dL (ref >39)
LDL Particle Number: 1842 nmol/L — ABNORMAL HIGH (ref <1000)
LDL Size: 21 nm (ref >20.5)
LDL-C (NIH Calc): 123 mg/dL — ABNORMAL HIGH (ref 0–99)
LP-IR Score: 71 — ABNORMAL HIGH (ref <=45)
Small LDL Particle Number: 880 nmol/L — ABNORMAL HIGH (ref <=527)
Triglycerides: 125 mg/dL (ref 0–149)

## 2022-12-29 ENCOUNTER — Telehealth: Payer: Self-pay

## 2022-12-29 DIAGNOSIS — Z79899 Other long term (current) drug therapy: Secondary | ICD-10-CM

## 2022-12-29 DIAGNOSIS — E785 Hyperlipidemia, unspecified: Secondary | ICD-10-CM

## 2022-12-29 NOTE — Telephone Encounter (Signed)
Unable to leave the pt a message... I sent her a My Chart message and will try her again.

## 2022-12-29 NOTE — Telephone Encounter (Signed)
-----   Message from [place] sent at 12/23/2022  7:28 AM EDT ----- LIpids are still not under control LDL should be around 70 given that she has mild plaquing of coronary arteries 1.  Work at [employer]     2    Confirm taking lipitor  If she is then I would top and switch to repatha injections

## 2022-12-29 NOTE — Telephone Encounter (Signed)
See MyChart encounter

## 2023-01-10 ENCOUNTER — Encounter: Payer: Self-pay | Admitting: Family

## 2023-01-10 NOTE — Telephone Encounter (Signed)
Schedule appointment?

## 2023-01-17 ENCOUNTER — Encounter: Payer: Self-pay | Admitting: Pharmacist

## 2023-01-17 ENCOUNTER — Ambulatory Visit: Payer: Federal, State, Local not specified - PPO | Attending: Internal Medicine | Admitting: Pharmacist

## 2023-01-17 DIAGNOSIS — E785 Hyperlipidemia, unspecified: Secondary | ICD-10-CM

## 2023-01-17 MED ORDER — NEXLIZET 180-10 MG PO TABS: 1.0000 | ORAL_TABLET | Freq: Every day | ORAL | 3 refills | Status: DC

## 2023-01-17 MED ORDER — ATORVASTATIN CALCIUM 80 MG PO TABS: 80.0000 mg | ORAL_TABLET | Freq: Every day | ORAL | 3 refills | Status: DC

## 2023-01-17 NOTE — Patient Instructions (Addendum)
Increase atorvastatin to 80 mg daily. You may take 2 of the 40 mg tablets if you have some left at home.  We will start insurance authorization for Nexlizet and reach out to you when we hear back from them.   Please follow up and get fasting labs on 10/31 after 7:30 AM.

## 2023-01-17 NOTE — Progress Notes (Signed)
Patient ID: Allison Wright                 DOB: 04/20/1966                    MRN: [id number]     HPI: Allison Wright is a 57 y.o. female patient referred to lipid clinic by Dr. Tenny Craw. PMH is significant for HLD, prediabetes, and osteoarthritis. Last saw Dr. Tenny Craw in Feb 2024 and was started on atorvastatin 20 mg for elevated LDL. Follow up NMR lipoprofile in March showed elevated LDL particle #. She also had an elevated Lp(a) 187 and ApoB 95. Recommended CT to evaluate for calcifications. CT completed on 4/30 showed mild calcification of LAD and brachiocephalic artery. Atorvastatin was increased to 40 mg. Appears she filled both the 20 mg and 40 mg tabs on 10/11/22 for 90 day supply. NMR lipoprofile remained elevated 12/19/22. She was referred to pharmacy for lipid management.  Patient reports to clinic today doing well. Initially was unsure if she has been taking atorvastatin 20 mg or 40 mg, but later felt confident she has been taking 40 mg tablets. Has been tolerating well with no issues. Reports occasionally missing doses because she was told to take atorvastatin in the evening, but she takes all her other medications in the morning. Reports she gets 10,000 steps in most days of the week and has been trying to go to the gym more over the past few weeks.   Current Medications: atorvastatin 40 mg daily Intolerances: none Risk Factors: elevated LDL particle #, elevated Lp(a), CT with calcifications of LAD and brachiocephalic artery LDL goal: <70 mg/dl  Diet:  Breakfast: Protein shake. Protein mix and fresh fruit Lunch: Leftovers. Meat, veggies, salad 3x per week Diner: Sandwich, cereal, popcorn Drinks: Water  Snack: Chips  Exercise: Walks 10,000 steps per day except for maybe 1-2 days per week, Uses treadmill 2-3 times in past couple weeks, does weight exercises on machines at the gym  Family History: Father- diabetes, heart attack; mother- cirrhosis, alcohol abuse; paternal grandmother- heart  disease  Social History: The patient reports that she quit smoking about 26 years ago. She reports that she does not drink alcohol and does not use drugs.    Labs: 12/19/22: TC 211, TG 125, HDL 66, LDL 123, LDL particle # 1842 (atorvastatin 40 mg) 08/24/22: TC 187, TG 85, HDL 68, LDL 104, LDL particle # 1714 (atorvastatin 20 mg) 06/05/22: TC 271, TG 147, HDL 70, LDL 175 (no meds) 12/04/27: TC 284, TG 98, HDL 75, LDL 192 (no meds) Past Medical History:  Diagnosis Date   Allergy    Arthritis    Heart murmur    Post-menopause    Vertigo     Current Outpatient Medications on File Prior to Visit  Medication Sig Dispense Refill   atorvastatin (LIPITOR) 40 MG tablet Take 1 tablet (40 mg total) by mouth daily. 90 tablet 3   diclofenac Sodium (VOLTAREN) 1 % GEL Apply topically 4 (four) times daily.     ibuprofen (ADVIL) 600 MG tablet Take 600 mg by mouth every 6 (six) hours as needed for mild pain or moderate pain.     meloxicam (MOBIC) 7.5 MG tablet TAKE 1 TABLET BY MOUTH EVERY DAY 20 tablet 0   tolterodine (DETROL LA) 4 MG 24 hr capsule Take 4 mg by mouth daily.     No current facility-administered medications on file prior to visit.    Allergies  Allergen  Reactions   Other     Seasonal    Assessment/Plan:  1. Hyperlipidemia - Uncontrolled based on LDL 123, above goal <70. Baseline LDL as high as 192. Discordance with elevated LDL particle # of 1842, goal < 700. ApoB elevated at 95 above goal < 80. Needs additional therapy for lipid lowering. Will increase atorvastatin to 80 mg to optimize her statin dosing. Counseled patient to take atorvastatin in the morning with her other medications to improve adherence. Reviewed options Repatha (also has elevated Lp(a) which this would help) vs. Nexlizet. She is hesitant about giving injections. Discussed mechanisms of action, dosing, side effects and potential decreases in LDL cholesterol. Plan to start Nexlizet per patient preference. Nexlizet along  with increasing atorvastatin should bring her LDL to goal. Nexlizet PA approved through 01/17/24. $10 co-pay card activated. Encouraged her to continue exercising at the gym and eating a heart healthy diet. Follow up lipid panel due in ~8 weeks.  Adam Phenix, PharmD PGY-1 Pharmacy Resident  Megan E. Supple, PharmD, BCACP, CPP [facility] [address], [place], [place] [address] Phone: [phone]; Fax: [phone] 01/17/2023 4:24 PM

## 2023-02-06 ENCOUNTER — Encounter: Payer: Self-pay | Admitting: Family

## 2023-02-06 ENCOUNTER — Ambulatory Visit: Payer: Federal, State, Local not specified - PPO | Admitting: Family

## 2023-02-06 VITALS — BP 111/71 | HR 64 | Temp 97.9°F | Ht 64.0 in | Wt 228.2 lb

## 2023-02-06 DIAGNOSIS — R200 Anesthesia of skin: Secondary | ICD-10-CM

## 2023-02-06 DIAGNOSIS — R7303 Prediabetes: Secondary | ICD-10-CM | POA: Diagnosis not present

## 2023-02-06 DIAGNOSIS — M5440 Lumbago with sciatica, unspecified side: Secondary | ICD-10-CM

## 2023-02-06 MED ORDER — GABAPENTIN 300 MG PO CAPS: 300.0000 mg | ORAL_CAPSULE | Freq: Two times a day (BID) | ORAL | 1 refills | Status: DC

## 2023-02-06 NOTE — Progress Notes (Unsigned)
Patient ID: Allison Wright, female    DOB: Jul 22, 1965  MRN: [id number]  CC: Sciatica  Subjective: Allison Wright is a 57 y.o. female who presents for sciatica.   Her concerns today include: ***  Patient Active Problem List   Diagnosis Date Noted   Hyperlipidemia 06/06/2022   Overactive bladder 01/28/2020   Dizziness 01/30/2018   Stress fracture of left calcaneus 08/28/2017   Prediabetes 10/12/2016   Overweight 10/12/2016   Systolic ejection murmur 10/09/2016     Current Outpatient Medications on File Prior to Visit  Medication Sig Dispense Refill   atorvastatin (LIPITOR) 80 MG tablet Take 1 tablet (80 mg total) by mouth daily. 90 tablet 3   Bempedoic Acid-Ezetimibe (NEXLIZET) 180-10 MG TABS Take 1 tablet by mouth daily. 90 tablet 3   ibuprofen (ADVIL) 600 MG tablet Take 600 mg by mouth every 6 (six) hours as needed for mild pain or moderate pain.     meloxicam (MOBIC) 7.5 MG tablet TAKE 1 TABLET BY MOUTH EVERY DAY 20 tablet 0   tolterodine (DETROL LA) 4 MG 24 hr capsule Take 4 mg by mouth daily.     diclofenac Sodium (VOLTAREN) 1 % GEL Apply topically 4 (four) times daily.     No current facility-administered medications on file prior to visit.    Allergies  Allergen Reactions   Other     Seasonal    Social History   Socioeconomic History   Marital status: Single    Spouse name: Not on file   Number of children: Not on file   Years of education: Not on file   Highest education level: Not on file  Occupational History   Not on file  Tobacco Use   Smoking status: Former    Current packs/day: 0.00    Average packs/day: 0.5 packs/day for 10.0 years (5.0 ttl pk-yrs)    Types: Cigarettes    Start date: 05/29/1986    Quit date: 05/29/1996    Years since quitting: 26.7    Passive exposure: Past   Smokeless tobacco: Never  Substance and Sexual Activity   Alcohol use: No    Alcohol/week: 0.0 standard drinks of alcohol   Drug use: No   Sexual activity: Never  Other  Topics Concern   Not on file  Social History Narrative   Right handed   Caffeine-rarely usese    Social Determinants of Health   Financial Resource Strain: Not on file  Food Insecurity: Not on file  Transportation Needs: Not on file  Physical Activity: Not on file  Stress: Not on file  Social Connections: Not on file  Intimate Partner Violence: Not on file    Family History  Problem Relation Age of Onset   Cirrhosis Mother    Alcohol abuse Mother    Diabetes Father 30   Heart attack Father    Heart disease Paternal Grandmother     Past Surgical History:  Procedure Laterality Date   ABDOMINAL HYSTERECTOMY  2001   Retained cervix    ROS: Review of Systems Negative except as stated above  PHYSICAL EXAM: BP 111/71   Pulse 64   Temp 97.9 F (36.6 C) (Oral)   Ht 5\' 4"  (1.626 m)   Wt 228 lb 3.2 oz (103.5 kg)   SpO2 95%   BMI 39.17 kg/m   Physical Exam  {female adult master:310786} {female adult master:310785}     Latest Ref Rng & Units 08/24/2022    7:44 AM 06/05/2022  3:07 PM 08/17/2021    6:28 PM  CMP  Glucose 70 - 99 mg/dL  93  86   BUN 6 - 24 mg/dL  12  8   Creatinine 0.98 - 1.00 mg/dL  1.19  1.47   Sodium 829 - 144 mmol/L  141  140   Potassium 3.5 - 5.2 mmol/L  4.5  3.8   Chloride 96 - 106 mmol/L  102  105   CO2 20 - 29 mmol/L  24  25   Calcium 8.7 - 10.2 mg/dL  9.5  9.0   Total Protein 6.0 - 8.5 g/dL 7.1  7.5    Total Bilirubin 0.0 - 1.2 mg/dL <5.6  <2.1    Alkaline Phos 44 - 121 IU/L 110  96    AST 0 - 40 IU/L 14  21    ALT 0 - 32 IU/L 19  21     Lipid Panel     Component Value Date/Time   CHOL 271 (H) 06/05/2022 1507   TRIG 147 06/05/2022 1507   HDL 70 06/05/2022 1507   CHOLHDL 3.9 06/05/2022 1507   LDLCALC 175 (H) 06/05/2022 1507    CBC    Component Value Date/Time   WBC 9.1 06/05/2022 1507   WBC 10.1 08/17/2021 1828   RBC 4.35 06/05/2022 1507   RBC 3.95 08/17/2021 1828   HGB 12.1 06/05/2022 1507   HCT 38.2 06/05/2022 1507   PLT  366 06/05/2022 1507   MCV 88 06/05/2022 1507   MCH 27.8 06/05/2022 1507   MCH 29.1 08/17/2021 1828   MCHC 31.7 06/05/2022 1507   MCHC 31.8 08/17/2021 1828   RDW 14.5 06/05/2022 1507   LYMPHSABS 3.5 08/17/2021 1828   MONOABS 0.7 08/17/2021 1828   EOSABS 0.2 08/17/2021 1828   BASOSABS 0.0 08/17/2021 1828    ASSESSMENT AND PLAN:  There are no diagnoses linked to this encounter.   Patient was given the opportunity to ask questions.  Patient verbalized understanding of the plan and was able to repeat key elements of the plan. Patient was given clear instructions to go to Emergency Department or return to medical center if symptoms don't improve, worsen, or new problems develop.The patient verbalized understanding.   No orders of the defined types were placed in this encounter.    Requested Prescriptions    No prescriptions requested or ordered in this encounter    No follow-ups on file.  Rema Fendt, NP

## 2023-02-06 NOTE — Progress Notes (Unsigned)
Pt wants to talk about sciatic nerve. States her lower back hurts.   Pt here for tingling in hands, and some in her feet.

## 2023-02-07 LAB — HEMOGLOBIN A1C
Est. average glucose Bld gHb Est-mCnc: 137 mg/dL
Hgb A1c MFr Bld: 6.4 % — ABNORMAL HIGH (ref 4.8–5.6)

## 2023-03-06 ENCOUNTER — Encounter: Payer: Self-pay | Admitting: Family

## 2023-03-06 ENCOUNTER — Ambulatory Visit: Payer: Federal, State, Local not specified - PPO | Admitting: Family

## 2023-03-06 VITALS — BP 136/82 | HR 65 | Temp 98.0°F | Resp 16 | Ht 64.0 in | Wt 227.6 lb

## 2023-03-06 DIAGNOSIS — R200 Anesthesia of skin: Secondary | ICD-10-CM | POA: Diagnosis not present

## 2023-03-06 DIAGNOSIS — Z23 Encounter for immunization: Secondary | ICD-10-CM | POA: Diagnosis not present

## 2023-03-06 DIAGNOSIS — M5440 Lumbago with sciatica, unspecified side: Secondary | ICD-10-CM | POA: Diagnosis not present

## 2023-03-06 MED ORDER — GABAPENTIN 300 MG PO CAPS: 300.0000 mg | ORAL_CAPSULE | Freq: Three times a day (TID) | ORAL | 1 refills | Status: DC

## 2023-03-06 NOTE — Progress Notes (Signed)
Patient ID: YUMI INSALACO, female    DOB: 04/03/1966  MRN: [id number]  CC: Follow-Up  Subjective: Allison Wright is a 57 y.o. female who presents for follow-up.   Her concerns today include:  Reports left hand numbness improved. States recently beginning to have right hand numbness. She denies recent trauma/injury and red flag symptoms. States she thinks right hand numbness is related to overuse. Reports doesn't feel low back pain/sciatica improved much. She is taking Gabapentin as prescribed.   Patient Active Problem List   Diagnosis Date Noted   Hyperlipidemia 06/06/2022   Overactive bladder 01/28/2020   Dizziness 01/30/2018   Stress fracture of left calcaneus 08/28/2017   Prediabetes 10/12/2016   Overweight 10/12/2016   Systolic ejection murmur 10/09/2016     Current Outpatient Medications on File Prior to Visit  Medication Sig Dispense Refill   atorvastatin (LIPITOR) 80 MG tablet Take 1 tablet (80 mg total) by mouth daily. 90 tablet 3   Bempedoic Acid-Ezetimibe (NEXLIZET) 180-10 MG TABS Take 1 tablet by mouth daily. 90 tablet 3   ibuprofen (ADVIL) 600 MG tablet Take 600 mg by mouth every 6 (six) hours as needed for mild pain or moderate pain.     meloxicam (MOBIC) 7.5 MG tablet TAKE 1 TABLET BY MOUTH EVERY DAY 20 tablet 0   tolterodine (DETROL LA) 4 MG 24 hr capsule Take 4 mg by mouth daily.     No current facility-administered medications on file prior to visit.    Allergies  Allergen Reactions   Other     Seasonal    Social History   Socioeconomic History   Marital status: Single    Spouse name: Not on file   Number of children: Not on file   Years of education: Not on file   Highest education level: Not on file  Occupational History   Not on file  Tobacco Use   Smoking status: Former    Current packs/day: 0.00    Average packs/day: 0.5 packs/day for 10.0 years (5.0 ttl pk-yrs)    Types: Cigarettes    Start date: 05/29/1986    Quit date: 05/29/1996    Years  since quitting: 26.7    Passive exposure: Past   Smokeless tobacco: Never  Substance and Sexual Activity   Alcohol use: No    Alcohol/week: 0.0 standard drinks of alcohol   Drug use: No   Sexual activity: Never  Other Topics Concern   Not on file  Social History Narrative   Right handed   Caffeine-rarely usese    Social Determinants of Health   Financial Resource Strain: Not on file  Food Insecurity: Not on file  Transportation Needs: Not on file  Physical Activity: Not on file  Stress: Not on file  Social Connections: Not on file  Intimate Partner Violence: Not on file    Family History  Problem Relation Age of Onset   Cirrhosis Mother    Alcohol abuse Mother    Diabetes Father 7   Heart attack Father    Heart disease Paternal Grandmother     Past Surgical History:  Procedure Laterality Date   ABDOMINAL HYSTERECTOMY  2001   Retained cervix    ROS: Review of Systems Negative except as stated above  PHYSICAL EXAM: BP 136/82   Pulse 65   Temp 98 F (36.7 C) (Oral)   Resp 16   Ht 5\' 4"  (1.626 m)   Wt 227 lb 9.6 oz (103.2 kg)   SpO2  95%   BMI 39.07 kg/m   Physical Exam HENT:     Head: Normocephalic and atraumatic.     Nose: Nose normal.     Mouth/Throat:     Mouth: Mucous membranes are moist.     Pharynx: Oropharynx is clear.  Eyes:     Extraocular Movements: Extraocular movements intact.     Conjunctiva/sclera: Conjunctivae normal.     Pupils: Pupils are equal, round, and reactive to light.  Cardiovascular:     Rate and Rhythm: Normal rate and regular rhythm.     Pulses: Normal pulses.     Heart sounds: Normal heart sounds.  Pulmonary:     Effort: Pulmonary effort is normal.     Breath sounds: Normal breath sounds.  Musculoskeletal:        General: Normal range of motion.     Right shoulder: Normal.     Left shoulder: Normal.     Right upper arm: Normal.     Left upper arm: Normal.     Right elbow: Normal.     Left elbow: Normal.      Right forearm: Normal.     Left forearm: Normal.     Right wrist: Normal.     Left wrist: Normal.     Right hand: Normal.     Left hand: Normal.     Cervical back: Normal, normal range of motion and neck supple.     Thoracic back: Normal.     Lumbar back: Normal.     Right hip: Normal.     Left hip: Normal.     Right upper leg: Normal.     Left upper leg: Normal.     Right knee: Normal.     Left knee: Normal.     Right lower leg: Normal.     Left lower leg: Normal.     Right ankle: Normal.     Left ankle: Normal.     Right foot: Normal.     Left foot: Normal.  Neurological:     General: No focal deficit present.     Mental Status: She is alert and oriented to person, place, and time.  Psychiatric:        Mood and Affect: Mood normal.        Behavior: Behavior normal.    ASSESSMENT AND PLAN: 1. Low back pain with sciatica, sciatica laterality unspecified, unspecified back pain laterality, unspecified chronicity - Increase Gabapentin from 300 mg twice daily to 300 mg three times daily. Counseled on medication adherence/adverse effects.  - Patient declined referral to Orthopedics.  - Follow-up with primary provider in 4 weeks or sooner if needed.  - gabapentin (NEURONTIN) 300 MG capsule; Take 1 capsule (300 mg total) by mouth 3 (three) times daily.  Dispense: 90 capsule; Refill: 1  2. Bilateral hand numbness - Increase Gabapentin from 300 mg twice daily to 300 mg three times daily. Counseled on medication adherence/adverse effects.  - Patient declined referral to Orthopedics.  - Follow-up with primary provider in 4 weeks or sooner if needed.  - gabapentin (NEURONTIN) 300 MG capsule; Take 1 capsule (300 mg total) by mouth 3 (three) times daily.  Dispense: 90 capsule; Refill: 1  3. Encounter for immunization - Administered.  - Flu vaccine trivalent PF, 6mos and older(Flulaval,Afluria,Fluarix,Fluzone)    Patient was given the opportunity to ask questions.  Patient verbalized  understanding of the plan and was able to repeat key elements of the plan. Patient was given clear  instructions to go to Emergency Department or return to medical center if symptoms don't improve, worsen, or new problems develop.The patient verbalized understanding.   Orders Placed This Encounter  Procedures   Flu vaccine trivalent PF, 6mos and older(Flulaval,Afluria,Fluarix,Fluzone)     Requested Prescriptions   Signed Prescriptions Disp Refills   gabapentin (NEURONTIN) 300 MG capsule 90 capsule 1    Sig: Take 1 capsule (300 mg total) by mouth 3 (three) times daily.    Return in about 4 weeks (around 04/03/2023).  Rema Fendt, NP

## 2023-03-19 ENCOUNTER — Ambulatory Visit: Payer: Federal, State, Local not specified - PPO | Attending: Internal Medicine

## 2023-03-19 DIAGNOSIS — E785 Hyperlipidemia, unspecified: Secondary | ICD-10-CM | POA: Diagnosis not present

## 2023-03-20 LAB — HEPATIC FUNCTION PANEL
ALT: 24 [IU]/L (ref 0–32)
AST: 18 [IU]/L (ref 0–40)
Albumin: 4.3 g/dL (ref 3.8–4.9)
Alkaline Phosphatase: 117 [IU]/L (ref 44–121)
Bilirubin Total: <0.2 mg/dL (ref 0.0–1.2)
Bilirubin, Direct: <0.1 mg/dL (ref 0.00–0.40)
Total Protein: 7.1 g/dL (ref 6.0–8.5)

## 2023-03-20 LAB — NMR, LIPOPROFILE
Cholesterol, Total: 153 mg/dL (ref 100–199)
HDL Particle Number: 43.3 umol/L (ref >=30.5)
HDL-C: 68 mg/dL (ref >39)
LDL Particle Number: 812 nmol/L (ref <1000)
LDL Size: 21.2 nm (ref >20.5)
LDL-C (NIH Calc): 69 mg/dL (ref 0–99)
LP-IR Score: 39 (ref <=45)
Small LDL Particle Number: 433 nmol/L (ref <=527)
Triglycerides: 88 mg/dL (ref 0–149)

## 2023-03-20 LAB — APOLIPOPROTEIN B: Apolipoprotein B: 69 mg/dL (ref <90)

## 2023-03-22 DIAGNOSIS — Z01419 Encounter for gynecological examination (general) (routine) without abnormal findings: Secondary | ICD-10-CM | POA: Diagnosis not present

## 2023-03-22 DIAGNOSIS — Z1231 Encounter for screening mammogram for malignant neoplasm of breast: Secondary | ICD-10-CM | POA: Diagnosis not present

## 2023-04-06 ENCOUNTER — Encounter: Payer: Self-pay | Admitting: Family

## 2023-04-06 ENCOUNTER — Ambulatory Visit: Payer: Federal, State, Local not specified - PPO | Admitting: Family

## 2023-04-06 VITALS — BP 135/85 | HR 68 | Temp 98.5°F | Ht 64.0 in | Wt 225.4 lb

## 2023-04-06 DIAGNOSIS — M7750 Other enthesopathy of unspecified foot: Secondary | ICD-10-CM

## 2023-04-06 NOTE — Progress Notes (Signed)
Patient ID: Allison Wright, female    DOB: December 05, 1965  MRN: [id number]  CC: Bone Spur  Subjective: Allison Wright is a 57 y.o. female who presents for bone spur.   Her concerns today include:  Reports bone spur of left heel x 3 years. Reports currently left heel pain. She denies recent trauma/injury and red flag symptoms. Reports in the past she was established with [facility]. States during that time she was prescribed a "boot" which helped. She requests referral back to [facility]. No further issues/concerns for discussion today.   Patient Active Problem List   Diagnosis Date Noted   Hyperlipidemia 06/06/2022   Overactive bladder 01/28/2020   Dizziness 01/30/2018   Stress fracture of left calcaneus 08/28/2017   Prediabetes 10/12/2016   Overweight 10/12/2016   Systolic ejection murmur 10/09/2016     Current Outpatient Medications on File Prior to Visit  Medication Sig Dispense Refill   atorvastatin (LIPITOR) 80 MG tablet Take 1 tablet (80 mg total) by mouth daily. 90 tablet 3   Bempedoic Acid-Ezetimibe (NEXLIZET) 180-10 MG TABS Take 1 tablet by mouth daily. 90 tablet 3   gabapentin (NEURONTIN) 300 MG capsule Take 1 capsule (300 mg total) by mouth 3 (three) times daily. 90 capsule 1   ibuprofen (ADVIL) 600 MG tablet Take 600 mg by mouth every 6 (six) hours as needed for mild pain or moderate pain.     meloxicam (MOBIC) 7.5 MG tablet TAKE 1 TABLET BY MOUTH EVERY DAY 20 tablet 0   tolterodine (DETROL LA) 4 MG 24 hr capsule Take 4 mg by mouth daily.     No current facility-administered medications on file prior to visit.    Allergies  Allergen Reactions   Other     Seasonal    Social History   Socioeconomic History   Marital status: Single    Spouse name: Not on file   Number of children: Not on file   Years of education: Not on file   Highest education level: Not on file  Occupational History   Not on file  Tobacco Use   Smoking status:  Former    Current packs/day: 0.00    Average packs/day: 0.5 packs/day for 10.0 years (5.0 ttl pk-yrs)    Types: Cigarettes    Start date: 05/29/1986    Quit date: 05/29/1996    Years since quitting: 26.8    Passive exposure: Past   Smokeless tobacco: Never  Substance and Sexual Activity   Alcohol use: No    Alcohol/week: 0.0 standard drinks of alcohol   Drug use: No   Sexual activity: Never  Other Topics Concern   Not on file  Social History Narrative   Right handed   Caffeine-rarely usese    Social Determinants of Health   Financial Resource Strain: Low Risk  (03/06/2023)   Overall Financial Resource Strain (CARDIA)    Difficulty of Paying Living Expenses: Not very hard  Food Insecurity: No Food Insecurity (03/06/2023)   Hunger Vital Sign    Worried About Running Out of Food in the Last Year: Never true    Ran Out of Food in the Last Year: Never true  Transportation Needs: No Transportation Needs (03/06/2023)   PRAPARE - Administrator, Civil Service (Medical): No    Lack of Transportation (Non-Medical): No  Physical Activity: Inactive (03/06/2023)   Exercise Vital Sign    Days of Exercise per Week: 0 days    Minutes  of Exercise per Session: 0 min  Stress: No Stress Concern Present (03/06/2023)   [employer] of Occupational Health - Occupational Stress Questionnaire    Feeling of Stress : Not at all  Social Connections: Socially Integrated (03/06/2023)   Social Connection and Isolation Panel [NHANES]    Frequency of Communication with Friends and Family: Three times a week    Frequency of Social Gatherings with Friends and Family: Three times a week    Attends Religious Services: More than 4 times per year    Active Member of Clubs or Organizations: No    Attends Banker Meetings: More than 4 times per year    Marital Status: Living with partner  Intimate Partner Violence: Not At Risk (03/06/2023)   Humiliation, Afraid, Rape, and Kick questionnaire     Fear of Current or Ex-Partner: No    Emotionally Abused: No    Physically Abused: No    Sexually Abused: No    Family History  Problem Relation Age of Onset   Cirrhosis Mother    Alcohol abuse Mother    Diabetes Father 33   Heart attack Father    Heart disease Paternal Grandmother     Past Surgical History:  Procedure Laterality Date   ABDOMINAL HYSTERECTOMY  2001   Retained cervix    ROS: Review of Systems Negative except as stated above  PHYSICAL EXAM: BP 135/85   Pulse 68   Temp 98.5 F (36.9 C) (Oral)   Ht 5\' 4"  (1.626 m)   Wt 225 lb 6.4 oz (102.2 kg)   SpO2 98%   BMI 38.69 kg/m   Physical Exam HENT:     Head: Normocephalic and atraumatic.     Nose: Nose normal.     Mouth/Throat:     Mouth: Mucous membranes are moist.     Pharynx: Oropharynx is clear.  Eyes:     Extraocular Movements: Extraocular movements intact.     Conjunctiva/sclera: Conjunctivae normal.     Pupils: Pupils are equal, round, and reactive to light.  Cardiovascular:     Rate and Rhythm: Normal rate and regular rhythm.     Pulses: Normal pulses.     Heart sounds: Normal heart sounds.  Pulmonary:     Effort: Pulmonary effort is normal.     Breath sounds: Normal breath sounds.  Musculoskeletal:        General: Normal range of motion.     Right shoulder: Normal.     Left shoulder: Normal.     Right upper arm: Normal.     Left upper arm: Normal.     Right elbow: Normal.     Left elbow: Normal.     Right forearm: Normal.     Left forearm: Normal.     Right wrist: Normal.     Left wrist: Normal.     Right hand: Normal.     Left hand: Normal.     Cervical back: Normal, normal range of motion and neck supple.     Thoracic back: Normal.     Lumbar back: Normal.     Right hip: Normal.     Left hip: Normal.     Right upper leg: Normal.     Left upper leg: Normal.     Right knee: Normal.     Left knee: Normal.     Right lower leg: Normal.     Left lower leg: Normal.     Right  ankle: Normal.  Left ankle: Normal.     Right foot: Normal.     Left foot: Normal.  Neurological:     General: No focal deficit present.     Mental Status: She is alert and oriented to person, place, and time.  Psychiatric:        Mood and Affect: Mood normal.        Behavior: Behavior normal.     ASSESSMENT AND PLAN: 1. Bone spur of foot - Chronic. - Patient declined pharmacological therapy.  - Referral to Orthopedic Surgery for evaluation/management. During the interim follow-up with primary provider as scheduled until established with referral.  - Ambulatory referral to Orthopedic Surgery    Patient was given the opportunity to ask questions.  Patient verbalized understanding of the plan and was able to repeat key elements of the plan. Patient was given clear instructions to go to Emergency Department or return to medical center if symptoms don't improve, worsen, or new problems develop.The patient verbalized understanding.   Orders Placed This Encounter  Procedures   Ambulatory referral to Orthopedic Surgery    Follow-up with primary provider as scheduled.   Rema Fendt, NP

## 2023-04-06 NOTE — Progress Notes (Signed)
Patient states her bone spur is acting back up.

## 2023-04-16 ENCOUNTER — Other Ambulatory Visit (INDEPENDENT_AMBULATORY_CARE_PROVIDER_SITE_OTHER): Payer: Federal, State, Local not specified - PPO

## 2023-04-16 ENCOUNTER — Encounter: Payer: Self-pay | Admitting: Orthopedic Surgery

## 2023-04-16 ENCOUNTER — Ambulatory Visit (INDEPENDENT_AMBULATORY_CARE_PROVIDER_SITE_OTHER): Payer: Federal, State, Local not specified - PPO | Admitting: Orthopedic Surgery

## 2023-04-16 DIAGNOSIS — M79672 Pain in left foot: Secondary | ICD-10-CM

## 2023-04-16 DIAGNOSIS — M7662 Achilles tendinitis, left leg: Secondary | ICD-10-CM | POA: Diagnosis not present

## 2023-04-16 DIAGNOSIS — M6702 Short Achilles tendon (acquired), left ankle: Secondary | ICD-10-CM | POA: Diagnosis not present

## 2023-04-16 NOTE — Progress Notes (Signed)
Office Visit Note   Patient: Allison Wright           Date of Birth: [birth year]           MRN: [id number] Visit Date: 04/16/2023              Requested by: Rema Fendt, NP [address] [place],  [place] [address] PCP: Rema Fendt, NP  Chief Complaint  Patient presents with   Left Foot - Pain      HPI: Patient is a 57 year old woman who is seen for chronic insertional Achilles tendinitis on the left.  Patient denies any specific injury.  Patient states she was treated at [facility] several years ago and treated with a fracture boot.  Patient states she is prediabetic.  Assessment & Plan: Visit Diagnoses:  1. Pain in left foot   2. Achilles tendinitis, left leg   3. Achilles tendon contracture, left     Plan: Patient was given instructions and demonstrated Achilles stretching.  She will do this 3 times a day a minute at a time.  She was given a 716 send heel lift to unload the Achilles tendinitis during the day.  Also recommended Voltaren gel 3 times a day.  Discussed that if she fails this treatment there is a possibility of shockwave therapy and gastrocnemius recession.  Follow-Up Instructions: Return if symptoms worsen or fail to improve.   Ortho Exam  Patient is alert, oriented, no adenopathy, well-dressed, normal affect, normal respiratory effort. Examination patient has a palpable dorsalis pedis pulse she has dorsiflexion 10 degrees short of neutral with her knee extended.  There is no tenderness to palpation along the Achilles but tenderness at the insertion.  The peroneal tendons and posterior tibial tendon are nontender to palpation.  Patient's most recent hemoglobin A1c is 6.4.  Imaging: XR Foot 2 Views Left  Result Date: 04/16/2023 2 view radiographs of the left foot shows calcification of the insertion of the Achilles as well as a plantar heel spur.  There are no signs of fractures.  No images are attached to the encounter.  Labs: Lab  Results  Component Value Date   HGBA1C 6.4 (H) 02/06/2023   HGBA1C 6.1 (H) 06/05/2022   HGBA1C 6.0 (H) 11/01/2020     Lab Results  Component Value Date   ALBUMIN 4.3 03/19/2023   ALBUMIN 4.3 08/24/2022   ALBUMIN 4.3 06/05/2022    No results found for: "MG" Lab Results  Component Value Date   VD25OH 35.3 10/15/2019   VD25OH 29.0 (L) 09/23/2018   VD25OH 32.3 07/31/2018    No results found for: "PREALBUMIN"    Latest Ref Rng & Units 06/05/2022    3:07 PM 08/17/2021    6:28 PM 11/01/2020    3:53 PM  CBC EXTENDED  WBC 3.4 - 10.8 x10E3/uL 9.1  10.1  9.6   RBC 3.77 - 5.28 x10E6/uL 4.35  3.95  4.44   Hemoglobin 11.1 - 15.9 g/dL 19.1  47.8  29.5   HCT 34.0 - 46.6 % 38.2  36.2  40.1   Platelets 150 - 450 x10E3/uL 366  329  372   NEUT# 1.7 - 7.7 K/uL  5.7    Lymph# 0.7 - 4.0 K/uL  3.5       There is no height or weight on file to calculate BMI.  Orders:  Orders Placed This Encounter  Procedures   XR Foot 2 Views Left   No orders of  the defined types were placed in this encounter.    Procedures: No procedures performed  Clinical Data: No additional findings.  ROS:  All other systems negative, except as noted in the HPI. Review of Systems  Objective: Vital Signs: There were no vitals taken for this visit.  Specialty Comments:  No specialty comments available.  PMFS History: Patient Active Problem List   Diagnosis Date Noted   Hyperlipidemia 06/06/2022   Overactive bladder 01/28/2020   Dizziness 01/30/2018   Stress fracture of left calcaneus 08/28/2017   Prediabetes 10/12/2016   Overweight 10/12/2016   Systolic ejection murmur 10/09/2016   Past Medical History:  Diagnosis Date   Allergy    Arthritis    Heart murmur    Post-menopause    Vertigo     Family History  Problem Relation Age of Onset   Cirrhosis Mother    Alcohol abuse Mother    Diabetes Father 68   Heart attack Father    Heart disease Paternal Grandmother     Past Surgical History:   Procedure Laterality Date   ABDOMINAL HYSTERECTOMY  2001   Retained cervix   Social History   Occupational History   Not on file  Tobacco Use   Smoking status: Former    Current packs/day: 0.00    Average packs/day: 0.5 packs/day for 10.0 years (5.0 ttl pk-yrs)    Types: Cigarettes    Start date: 05/29/1986    Quit date: 05/29/1996    Years since quitting: 26.8    Passive exposure: Past   Smokeless tobacco: Never  Substance and Sexual Activity   Alcohol use: No    Alcohol/week: 0.0 standard drinks of alcohol   Drug use: No   Sexual activity: Never

## 2023-05-17 ENCOUNTER — Ambulatory Visit: Payer: Self-pay

## 2023-05-17 NOTE — Telephone Encounter (Signed)
  Chief Complaint: vomiting Symptoms: vomiting x 3 in 24 hrs, abdominal cramping Frequency: last night  Pertinent Negatives: Patient denies diarrhea  Disposition: [] ED /[] Urgent Care (no appt availability in office) / [] Appointment(In office/virtual)/ []  [facility]/ [x] Home Care/ [] Refused Recommended Disposition /[] [facility]/ []  Follow-up with PCP Additional Notes: pt states had abdominal pain last night and started vomiting, had 2 spells last night and then 1 today so far. Tried to drink hot tea but came back up. Reviewed care advice with pt and recommended she could try Pepto or Emetrol OTC for sx as well. UC if no better in 48 hrs since office will be closed. Pt verbalized understanding.   Summary: vomiting   Vomiting , started yesterday evening.. tried to drink tea and it came up too  (within the last hour)         Reason for Disposition  MILD or MODERATE vomiting (e.g., 1 - 5 times / day)  Answer Assessment - Initial Assessment Questions 1. VOMITING SEVERITY: "How many times have you vomited in the past 24 hours?"     - MILD:  1 - 2 times/day    - MODERATE: 3 - 5 times/day, decreased oral intake without significant weight loss or symptoms of dehydration    - SEVERE: 6 or more times/day, vomits everything or nearly everything, with significant weight loss, symptoms of dehydration      3-4x  2. ONSET: "When did the vomiting begin?"      Last night  3. FLUIDS: "What fluids or food have you vomited up today?" "Have you been able to keep any fluids down?"     No tried hot tea  4. ABDOMEN PAIN: "Are your having any abdomen pain?" If Yes : "How bad is it and what does it feel like?" (e.g., crampy, dull, intermittent, constant)      Cramping  5. DIARRHEA: "Is there any diarrhea?" If Yes, ask: "How many times today?"      no 6. CONTACTS: "Is there anyone else in the family with the same symptoms?"      No 9. OTHER SYMPTOMS: "Do you have any other symptoms?"  (e.g., fever, headache, vertigo, vomiting blood or coffee grounds, recent head injury)     Sweating  Protocols used: Vomiting-A-AH

## 2023-06-07 ENCOUNTER — Ambulatory Visit (INDEPENDENT_AMBULATORY_CARE_PROVIDER_SITE_OTHER): Payer: BC Managed Care – PPO | Admitting: Family

## 2023-06-07 ENCOUNTER — Other Ambulatory Visit: Payer: Self-pay | Admitting: Family

## 2023-06-07 VITALS — BP 120/76 | HR 55 | Temp 98.0°F | Resp 16 | Ht 64.0 in | Wt 218.0 lb

## 2023-06-07 DIAGNOSIS — Z1329 Encounter for screening for other suspected endocrine disorder: Secondary | ICD-10-CM | POA: Diagnosis not present

## 2023-06-07 DIAGNOSIS — Z13228 Encounter for screening for other metabolic disorders: Secondary | ICD-10-CM

## 2023-06-07 DIAGNOSIS — R200 Anesthesia of skin: Secondary | ICD-10-CM

## 2023-06-07 DIAGNOSIS — R7303 Prediabetes: Secondary | ICD-10-CM | POA: Diagnosis not present

## 2023-06-07 DIAGNOSIS — M5440 Lumbago with sciatica, unspecified side: Secondary | ICD-10-CM

## 2023-06-07 DIAGNOSIS — Z0000 Encounter for general adult medical examination without abnormal findings: Secondary | ICD-10-CM

## 2023-06-07 DIAGNOSIS — Z130 Encounter for screening for diseases of the blood and blood-forming organs and certain disorders involving the immune mechanism: Secondary | ICD-10-CM | POA: Diagnosis not present

## 2023-06-07 NOTE — Progress Notes (Addendum)
 Patient ID: Allison Wright, female    DOB: 1966-05-04  MRN: [id number]  CC: Annual Exam  Subjective: Allison Wright is a 58 y.o. female who presents for annual exam.   Her concerns today include:  - None. - Established with Cardiology for chronic conditions.    Patient Active Problem List   Diagnosis Date Noted   Hyperlipidemia 06/06/2022   Overactive bladder 01/28/2020   Dizziness 01/30/2018   Stress fracture of left calcaneus 08/28/2017   Prediabetes 10/12/2016   Overweight 10/12/2016   Systolic ejection murmur 10/09/2016     Current Outpatient Medications on File Prior to Visit  Medication Sig Dispense Refill   atorvastatin  (LIPITOR) 80 MG tablet Take 1 tablet (80 mg total) by mouth daily. 90 tablet 3   Bempedoic Acid-Ezetimibe (NEXLIZET ) 180-10 MG TABS Take 1 tablet by mouth daily. 90 tablet 3   gabapentin  (NEURONTIN ) 300 MG capsule Take 1 capsule (300 mg total) by mouth 3 (three) times daily. 90 capsule 1   meloxicam  (MOBIC ) 7.5 MG tablet TAKE 1 TABLET BY MOUTH EVERY DAY 20 tablet 0   tolterodine (DETROL LA) 4 MG 24 hr capsule Take 4 mg by mouth daily.     ibuprofen  (ADVIL ) 600 MG tablet Take 600 mg by mouth every 6 (six) hours as needed for mild pain or moderate pain.     No current facility-administered medications on file prior to visit.    Allergies  Allergen Reactions   Other     Seasonal    Social History   Socioeconomic History   Marital status: Single    Spouse name: Not on file   Number of children: Not on file   Years of education: Not on file   Highest education level: Not on file  Occupational History   Not on file  Tobacco Use   Smoking status: Former    Current packs/day: 0.00    Average packs/day: 0.5 packs/day for 10.0 years (5.0 ttl pk-yrs)    Types: Cigarettes    Start date: 05/29/1986    Quit date: 05/29/1996    Years since quitting: 27.0    Passive exposure: Past   Smokeless tobacco: Never  Substance and Sexual Activity   Alcohol  use: No    Alcohol/week: 0.0 standard drinks of alcohol   Drug use: No   Sexual activity: Never  Other Topics Concern   Not on file  Social History Narrative   Right handed   Caffeine-rarely usese    Social Drivers of Health   Financial Resource Strain: Low Risk  (03/06/2023)   Overall Financial Resource Strain (CARDIA)    Difficulty of Paying Living Expenses: Not very hard  Food Insecurity: No Food Insecurity (03/06/2023)   Hunger Vital Sign    Worried About Running Out of Food in the Last Year: Never true    Ran Out of Food in the Last Year: Never true  Transportation Needs: No Transportation Needs (03/06/2023)   PRAPARE - Administrator, Civil Service (Medical): No    Lack of Transportation (Non-Medical): No  Physical Activity: Inactive (03/06/2023)   Exercise Vital Sign    Days of Exercise per Week: 0 days    Minutes of Exercise per Session: 0 min  Stress: No Stress Concern Present (03/06/2023)   [employer] of Occupational Health - Occupational Stress Questionnaire    Feeling of Stress : Not at all  Social Connections: Socially Integrated (03/06/2023)   Social Connection and Isolation Panel [NHANES]  Frequency of Communication with Friends and Family: Three times a week    Frequency of Social Gatherings with Friends and Family: Three times a week    Attends Religious Services: More than 4 times per year    Active Member of Clubs or Organizations: No    Attends Banker Meetings: More than 4 times per year    Marital Status: Living with partner  Intimate Partner Violence: Not At Risk (03/06/2023)   Humiliation, Afraid, Rape, and Kick questionnaire    Fear of Current or Ex-Partner: No    Emotionally Abused: No    Physically Abused: No    Sexually Abused: No    Family History  Problem Relation Age of Onset   Cirrhosis Mother    Alcohol abuse Mother    Diabetes Father 57   Heart attack Father    Heart disease Paternal Grandmother      Past Surgical History:  Procedure Laterality Date   ABDOMINAL HYSTERECTOMY  2001   Retained cervix    ROS: Review of Systems Negative except as stated above  PHYSICAL EXAM: BP 120/76 (BP Location: Right Arm, Patient Position: Sitting, Cuff Size: Large)   Pulse (!) 55   Temp 98 F (36.7 C) (Oral)   Resp 16   Ht 5' 4 (1.626 m)   Wt 218 lb (98.9 kg)   SpO2 96%   BMI 37.42 kg/m   Physical Exam HENT:     Head: Normocephalic and atraumatic.     Right Ear: Tympanic membrane, ear canal and external ear normal.     Left Ear: Tympanic membrane, ear canal and external ear normal.     Nose: Nose normal.     Mouth/Throat:     Mouth: Mucous membranes are moist.     Pharynx: Oropharynx is clear.  Eyes:     Extraocular Movements: Extraocular movements intact.     Conjunctiva/sclera: Conjunctivae normal.     Pupils: Pupils are equal, round, and reactive to light.  Neck:     Thyroid : No thyroid  mass, thyromegaly or thyroid  tenderness.  Cardiovascular:     Rate and Rhythm: Bradycardia present.     Pulses: Normal pulses.     Heart sounds: Normal heart sounds.  Pulmonary:     Effort: Pulmonary effort is normal.     Breath sounds: Normal breath sounds.  Chest:     Comments: Patient declined.  Abdominal:     General: Bowel sounds are normal.     Palpations: Abdomen is soft.  Genitourinary:    Comments: Patient declined.  Musculoskeletal:        General: Normal range of motion.     Right shoulder: Normal.     Left shoulder: Normal.     Right upper arm: Normal.     Left upper arm: Normal.     Right elbow: Normal.     Left elbow: Normal.     Right forearm: Normal.     Left forearm: Normal.     Right wrist: Normal.     Left wrist: Normal.     Right hand: Normal.     Left hand: Normal.     Cervical back: Normal, normal range of motion and neck supple.     Thoracic back: Normal.     Lumbar back: Normal.     Right hip: Normal.     Left hip: Normal.     Right upper leg:  Normal.     Left upper leg: Normal.  Right knee: Normal.     Left knee: Normal.     Right lower leg: Normal.     Left lower leg: Normal.     Right ankle: Normal.     Left ankle: Normal.     Right foot: Normal.     Left foot: Normal.  Skin:    General: Skin is warm and dry.     Capillary Refill: Capillary refill takes less than 2 seconds.  Neurological:     General: No focal deficit present.     Mental Status: She is alert and oriented to person, place, and time.  Psychiatric:        Mood and Affect: Mood normal.        Behavior: Behavior normal.     ASSESSMENT AND PLAN: 1. Annual physical exam (Primary) - Counseled on 150 minutes of exercise per week as tolerated, healthy eating (including decreased daily intake of saturated fats, cholesterol, added sugars, sodium), STI prevention, and routine healthcare maintenance.  2. Screening for metabolic disorder - Routine screening.  - CMP14+EGFR  3. Screening for deficiency anemia - Routine screening.  - CBC  4. Prediabetes - Routine screening.  - Hemoglobin A1c  5. Thyroid  disorder screen - Routine screening.  - TSH   Patient was given the opportunity to ask questions.  Patient verbalized understanding of the plan and was able to repeat key elements of the plan. Patient was given clear instructions to go to Emergency Department or return to medical center if symptoms don't improve, worsen, or new problems develop.The patient verbalized understanding.   Orders Placed This Encounter  Procedures   CBC   CMP14+EGFR   Hemoglobin A1c   TSH    Return in about 1 year (around 06/06/2024) for Physical per patient preference.  Greig JINNY Drones, NP

## 2023-06-07 NOTE — Progress Notes (Signed)
CPE

## 2023-06-08 LAB — CMP14+EGFR
ALT: 46 [IU]/L — ABNORMAL HIGH (ref 0–32)
AST: 28 [IU]/L (ref 0–40)
Albumin: 4.2 g/dL (ref 3.8–4.9)
Alkaline Phosphatase: 135 [IU]/L — ABNORMAL HIGH (ref 44–121)
BUN/Creatinine Ratio: 15 (ref 9–23)
BUN: 12 mg/dL (ref 6–24)
Bilirubin Total: 0.3 mg/dL (ref 0.0–1.2)
CO2: 22 mmol/L (ref 20–29)
Calcium: 9.4 mg/dL (ref 8.7–10.2)
Chloride: 104 mmol/L (ref 96–106)
Creatinine, Ser: 0.79 mg/dL (ref 0.57–1.00)
Globulin, Total: 2.8 g/dL (ref 1.5–4.5)
Glucose: 98 mg/dL (ref 70–99)
Potassium: 4.5 mmol/L (ref 3.5–5.2)
Sodium: 145 mmol/L — ABNORMAL HIGH (ref 134–144)
Total Protein: 7 g/dL (ref 6.0–8.5)
eGFR: 87 mL/min/{1.73_m2} (ref >59)

## 2023-06-08 LAB — TSH: TSH: 1.95 u[IU]/mL (ref 0.450–4.500)

## 2023-06-08 LAB — HEMOGLOBIN A1C
Est. average glucose Bld gHb Est-mCnc: 134 mg/dL
Hgb A1c MFr Bld: 6.3 % — ABNORMAL HIGH (ref 4.8–5.6)

## 2023-06-11 ENCOUNTER — Other Ambulatory Visit: Payer: Self-pay | Admitting: Family

## 2023-06-11 DIAGNOSIS — Z13228 Encounter for screening for other metabolic disorders: Secondary | ICD-10-CM

## 2023-06-18 NOTE — Telephone Encounter (Signed)
Complete

## 2023-07-17 ENCOUNTER — Ambulatory Visit (INDEPENDENT_AMBULATORY_CARE_PROVIDER_SITE_OTHER): Payer: Federal, State, Local not specified - PPO | Admitting: Orthopedic Surgery

## 2023-07-17 DIAGNOSIS — M6702 Short Achilles tendon (acquired), left ankle: Secondary | ICD-10-CM

## 2023-07-17 DIAGNOSIS — M7662 Achilles tendinitis, left leg: Secondary | ICD-10-CM

## 2023-07-18 ENCOUNTER — Encounter: Payer: Self-pay | Admitting: Orthopedic Surgery

## 2023-07-18 NOTE — Progress Notes (Signed)
Office Visit Note   Patient: Allison Wright           Date of Birth: September 16, 1965           MRN: [id number] Visit Date: 07/17/2023              Requested by: Rema Fendt, NP [address] [place],  [place] [address] PCP: Rema Fendt, NP  Chief Complaint  Patient presents with   Left Foot - Follow-up      HPI: Patient is a 58 year old woman who presents in follow-up for Achilles tendinitis and Achilles contracture.  She has been working on stretching she has used Voltaren gel.  Patient states that her symptoms have improved but she is not there yet.  Patient also states she has pain anteriorly over the ankle joint.  Assessment & Plan: Visit Diagnoses:  1. Achilles tendinitis, left leg   2. Achilles tendon contracture, left     Plan: The ankle was injected she tolerated this well.  She will continue with her Achilles stretching and we will have her make an appointment with Dr. Shon Baton for evaluation for shockwave therapy.  Follow-Up Instructions: Return in about 4 weeks (around 08/14/2023).   Ortho Exam  Patient is alert, oriented, no adenopathy, well-dressed, normal affect, normal respiratory effort. Examination patient is tender to palpation over the anterior medial joint line of the ankle.  There is no redness or cellulitis.  She is tender to palpation at the insertion of the Achilles.  With her knee extended she has dorsiflexion of 20 degrees.  Imaging: No results found. No images are attached to the encounter.  Labs: Lab Results  Component Value Date   HGBA1C 6.3 (H) 06/07/2023   HGBA1C 6.4 (H) 02/06/2023   HGBA1C 6.1 (H) 06/05/2022     Lab Results  Component Value Date   ALBUMIN 4.2 06/07/2023   ALBUMIN 4.3 03/19/2023   ALBUMIN 4.3 08/24/2022    No results found for: "MG" Lab Results  Component Value Date   VD25OH 35.3 10/15/2019   VD25OH 29.0 (L) 09/23/2018   VD25OH 32.3 07/31/2018    No results found for: "PREALBUMIN"    Latest  Ref Rng & Units 06/05/2022    3:07 PM 08/17/2021    6:28 PM 11/01/2020    3:53 PM  CBC EXTENDED  WBC 3.4 - 10.8 x10E3/uL 9.1  10.1  9.6   RBC 3.77 - 5.28 x10E6/uL 4.35  3.95  4.44   Hemoglobin 11.1 - 15.9 g/dL 65.7  84.6  96.2   HCT 34.0 - 46.6 % 38.2  36.2  40.1   Platelets 150 - 450 x10E3/uL 366  329  372   NEUT# 1.7 - 7.7 K/uL  5.7    Lymph# 0.7 - 4.0 K/uL  3.5       There is no height or weight on file to calculate BMI.  Orders:  Orders Placed This Encounter  Procedures   AMB referral to sports medicine   No orders of the defined types were placed in this encounter.    Procedures: No procedures performed  Clinical Data: No additional findings.  ROS:  All other systems negative, except as noted in the HPI. Review of Systems  Objective: Vital Signs: There were no vitals taken for this visit.  Specialty Comments:  No specialty comments available.  PMFS History: Patient Active Problem List   Diagnosis Date Noted   Hyperlipidemia 06/06/2022   Overactive bladder 01/28/2020   Dizziness  01/30/2018   Stress fracture of left calcaneus 08/28/2017   Prediabetes 10/12/2016   Overweight 10/12/2016   Systolic ejection murmur 10/09/2016   Past Medical History:  Diagnosis Date   Allergy    Arthritis    Heart murmur    Post-menopause    Vertigo     Family History  Problem Relation Age of Onset   Cirrhosis Mother    Alcohol abuse Mother    Diabetes Father 72   Heart attack Father    Heart disease Paternal Grandmother     Past Surgical History:  Procedure Laterality Date   ABDOMINAL HYSTERECTOMY  2001   Retained cervix   Social History   Occupational History   Not on file  Tobacco Use   Smoking status: Former    Current packs/day: 0.00    Average packs/day: 0.5 packs/day for 10.0 years (5.0 ttl pk-yrs)    Types: Cigarettes    Start date: 05/29/1986    Quit date: 05/29/1996    Years since quitting: 27.1    Passive exposure: Past   Smokeless tobacco: Never   Substance and Sexual Activity   Alcohol use: No    Alcohol/week: 0.0 standard drinks of alcohol   Drug use: No   Sexual activity: Never

## 2023-08-06 ENCOUNTER — Ambulatory Visit (INDEPENDENT_AMBULATORY_CARE_PROVIDER_SITE_OTHER): Payer: Self-pay | Admitting: Sports Medicine

## 2023-08-06 DIAGNOSIS — M7732 Calcaneal spur, left foot: Secondary | ICD-10-CM

## 2023-08-06 DIAGNOSIS — M7662 Achilles tendinitis, left leg: Secondary | ICD-10-CM

## 2023-08-06 NOTE — Progress Notes (Signed)
 Patient says that her left heel has been bothering her for about 1 month now. She did not have a known injury. She says that she has been told in the past that she has heel spurs. She takes prescription strength Ibuprofen as needed, as well as Voltaren gel, and does have relief. Patient is doing achilles stretches provided by Dr. Lajoyce Corners 2-3 times per week and those have been feeling okay.

## 2023-08-06 NOTE — Progress Notes (Signed)
 Allison Wright - 58 y.o. female MRN [id number]  Date of birth: 1966/04/03  Office Visit Note: Visit Date: 08/06/2023 PCP: Rema Fendt, NP Referred by: Nadara Mustard, MD  Subjective: Chief Complaint  Patient presents with   Left Foot - Pain   HPI: Allison Wright is a very pleasant 58 y.o. female who presents today for left heel pain x 1 month.  She has had pain in the posterior heel and sometimes in the anterior ankle for the last 2 months or so.  She did have an ankle injection 07/17/23.  She has been doing some stretching at Dr. Lajoyce Corners advised her still having pain with walking she does use ibuprofen as well as Voltaren gel with some relief.  Pertinent ROS were reviewed with the patient and found to be negative unless otherwise specified above in HPI.   Assessment & Plan: Visit Diagnoses:  1. Achilles tendinitis, left leg   2. Calcaneal spur of foot, left    Plan: Impression is chronic left heel pain and distal achilles pain with calcaneal spurring and notable enthesophytes off the superior calcaneus.  We did proceed with a trial of extracorporeal shockwave therapy, patient tolerated well.  She will continue her Achilles and posterior chain stretching and rehab exercises 2-3 times weekly.  I did provide her with a 6/16 inch heel lift to put into both shoes to help offload the Achilles.  She may continue her Voltaren gel or over-the-counter anti-inflammatories.  I would like to see what sort of cumulative benefit she has going forward for 2-3 sessions and then have her follow back up with Dr. Lajoyce Corners ultimately.  Follow-up: Return in about 8 days (around 08/14/2023) for f/u with [facility] for heel .   Meds & Orders: No orders of the defined types were placed in this encounter.  No orders of the defined types were placed in this encounter.    Procedures: Procedure: ECSWT Indications:  Achilles tendinitis, Calcanel spur   Procedure Details Consent: Risks of procedure as well as the  alternatives and risks of each were explained to the patient.  Verbal consent for procedure obtained. Time Out: Verified patient identification, verified procedure, site was marked, verified correct patient position. The area was cleaned with alcohol swab.     The left achilles and calcaneus was targeted for Extracorporeal shockwave therapy.    Preset: Achillodynia/Heel spur Power Level: 90 mJ Frequency: 10 Hz Impulse/cycles: 2250 Head size: Regular   Patient tolerated procedure well without immediate complications.         Clinical History: No specialty comments available.  She reports that she quit smoking about 27 years ago. Her smoking use included cigarettes. She started smoking about 37 years ago. She has a 5 pack-year smoking history. She has been exposed to tobacco smoke. She has never used smokeless tobacco.  Recent Labs    02/06/23 1614 06/07/23 1044  HGBA1C 6.4* 6.3*    Objective:    Physical Exam  Gen: Well-appearing, in no acute distress; non-toxic CV: Well-perfused. Warm.  Resp: Breathing unlabored on room air; no wheezing. Psych: Fluid speech in conversation; appropriate affect; normal thought process  Ortho Exam - Left foot/ankle: + TTP over the superior calcaneus and distal Achilles.  Dorsiflexion is slightly limited at about 95 degrees. No redness/swelling or ankle effusion.  Imaging:  XR Foot 2 Views Left 2 view radiographs of the left foot shows calcification of the insertion  of the Achilles as well as a plantar heel  spur.  There are no signs of  fractures.   Past Medical/Family/Surgical/Social History: Medications & Allergies reviewed per EMR, new medications updated. Patient Active Problem List   Diagnosis Date Noted   Hyperlipidemia 06/06/2022   Overactive bladder 01/28/2020   Dizziness 01/30/2018   Stress fracture of left calcaneus 08/28/2017   Prediabetes 10/12/2016   Overweight 10/12/2016   Systolic ejection murmur 10/09/2016   Past  Medical History:  Diagnosis Date   Allergy    Arthritis    Heart murmur    Post-menopause    Vertigo    Family History  Problem Relation Age of Onset   Cirrhosis Mother    Alcohol abuse Mother    Diabetes Father 71   Heart attack Father    Heart disease Paternal Grandmother    Past Surgical History:  Procedure Laterality Date   ABDOMINAL HYSTERECTOMY  2001   Retained cervix   Social History   Occupational History   Not on file  Tobacco Use   Smoking status: Former    Current packs/day: 0.00    Average packs/day: 0.5 packs/day for 10.0 years (5.0 ttl pk-yrs)    Types: Cigarettes    Start date: 05/29/1986    Quit date: 05/29/1996    Years since quitting: 27.2    Passive exposure: Past   Smokeless tobacco: Never  Substance and Sexual Activity   Alcohol use: No    Alcohol/week: 0.0 standard drinks of alcohol   Drug use: No   Sexual activity: Never

## 2023-08-14 ENCOUNTER — Ambulatory Visit: Admitting: Sports Medicine

## 2023-08-14 ENCOUNTER — Encounter: Payer: Self-pay | Admitting: Sports Medicine

## 2023-08-14 ENCOUNTER — Ambulatory Visit: Payer: Self-pay | Admitting: Orthopedic Surgery

## 2023-08-14 DIAGNOSIS — M7662 Achilles tendinitis, left leg: Secondary | ICD-10-CM

## 2023-08-14 DIAGNOSIS — M7732 Calcaneal spur, left foot: Secondary | ICD-10-CM

## 2023-08-14 NOTE — Progress Notes (Signed)
 Allison Wright - 58 y.o. female MRN [id number]  Date of birth: August 17, 1965  Office Visit Note: Visit Date: 08/14/2023 PCP: Rema Fendt, NP Referred by: Rema Fendt, NP  Subjective: Chief Complaint  Patient presents with   Left Foot - Follow-up   HPI: Allison Wright is a pleasant 58 y.o. female who presents today for follow-up of left heel pain.  Last week we did proceed with her first trial of extracorporeal shockwave therapy for the distal Achilles as well as her calcaneal spur.  She feels like she is at least 65-70% better after this treatment.  He has been standing a lot for work which seems to exacerbate her symptoms.  She is performing her home exercises and feels comfortable with this and making progress. She is using either ibuprofen or meloxicam only as needed.  Pertinent ROS were reviewed with the patient and found to be negative unless otherwise specified above in HPI.   Assessment & Plan: Visit Diagnoses:  1. Achilles tendinitis, left leg   2. Calcaneal spur of foot, left    Plan: Impression is chronic but improving left heel pain with both distal Achilles tendinitis and calcaneal spurring with insertional enthesophytes.  She feels about 70% better after our first extracorporeal shockwave treatment as well as her heel lifts in her home exercises.  We did repeat shockwave therapy today, patient tolerated well.  She will rest today and then may continue her home exercises and work as tolerated starting tomorrow.  She may use ibuprofen or meloxicam only as needed for breakthrough pain.  I would like to see her back in about 1 week for repeat a third shockwave treatment and further evaluate, hopefully at this time if she continues finding good cumulative benefit we can set her loose to continue with stretching and home rehab on her own. F/u next week.  Follow-up: Return in about 1 week (around 08/21/2023) for for R-heel/achilles (reg visit).   Meds & Orders: No orders of the  defined types were placed in this encounter.  No orders of the defined types were placed in this encounter.    Procedures: Procedure: ECSWT Indications:  Achilles tendinitis, Calcanel spur   Procedure Details Consent: Risks of procedure as well as the alternatives and risks of each were explained to the patient.  Verbal consent for procedure obtained. Time Out: Verified patient identification, verified procedure, site was marked, verified correct patient position. The area was cleaned with alcohol swab.     The left achilles and calcaneus was targeted for Extracorporeal shockwave therapy.    Preset: Achillodynia/Heel spur Power Level: 100 mJ Frequency: 11 Hz Impulse/cycles: 2300 Head size: Regular   Patient tolerated procedure well without immediate complications.      Clinical History: No specialty comments available.  She reports that she quit smoking about 27 years ago. Her smoking use included cigarettes. She started smoking about 37 years ago. She has a 5 pack-year smoking history. She has been exposed to tobacco smoke. She has never used smokeless tobacco.  Recent Labs    02/06/23 1614 06/07/23 1044  HGBA1C 6.4* 6.3*    Objective:    Physical Exam  Gen: Well-appearing, in no acute distress; non-toxic CV: Well-perfused. Warm.  Resp: Breathing unlabored on room air; no wheezing. Psych: Fluid speech in conversation; appropriate affect; normal thought process  Ortho Exam - Left foot/ankle: Very trivial TTP over the superior calcaneus and distal Achilles, although improved from prior visits.  There is no swelling  or redness in this location.  Otherwise unchanged exam from prior.  Imaging: No results found.  Past Medical/Family/Surgical/Social History: Medications & Allergies reviewed per EMR, new medications updated. Patient Active Problem List   Diagnosis Date Noted   Hyperlipidemia 06/06/2022   Overactive bladder 01/28/2020   Dizziness 01/30/2018   Stress  fracture of left calcaneus 08/28/2017   Prediabetes 10/12/2016   Overweight 10/12/2016   Systolic ejection murmur 10/09/2016   Past Medical History:  Diagnosis Date   Allergy    Arthritis    Heart murmur    Post-menopause    Vertigo    Family History  Problem Relation Age of Onset   Cirrhosis Mother    Alcohol abuse Mother    Diabetes Father 35   Heart attack Father    Heart disease Paternal Grandmother    Past Surgical History:  Procedure Laterality Date   ABDOMINAL HYSTERECTOMY  2001   Retained cervix   Social History   Occupational History   Not on file  Tobacco Use   Smoking status: Former    Current packs/day: 0.00    Average packs/day: 0.5 packs/day for 10.0 years (5.0 ttl pk-yrs)    Types: Cigarettes    Start date: 05/29/1986    Quit date: 05/29/1996    Years since quitting: 27.2    Passive exposure: Past   Smokeless tobacco: Never  Substance and Sexual Activity   Alcohol use: No    Alcohol/week: 0.0 standard drinks of alcohol   Drug use: No   Sexual activity: Never

## 2023-08-14 NOTE — Progress Notes (Signed)
 Patient says that she is feeling about 65-70% better from her first shockwave treatment. She says that today she is feeling it a little more than she has in the last week, but has also been standing a lot at work today. She is doing her exercises at home and those are going well. She says that she is taking her prescription Ibuprofen and Meloxicam only as needed.

## 2023-08-21 ENCOUNTER — Encounter: Payer: Self-pay | Admitting: Sports Medicine

## 2023-08-21 ENCOUNTER — Ambulatory Visit: Admitting: Sports Medicine

## 2023-08-21 DIAGNOSIS — M7732 Calcaneal spur, left foot: Secondary | ICD-10-CM

## 2023-08-21 DIAGNOSIS — M7662 Achilles tendinitis, left leg: Secondary | ICD-10-CM | POA: Diagnosis not present

## 2023-08-21 NOTE — Progress Notes (Signed)
 Allison Wright - 58 y.o. female MRN [id number]  Date of birth: [birth year]  Office Visit Note: Visit Date: 08/21/2023 PCP: Rema Fendt, NP Referred by: Rema Fendt, NP  Subjective: Chief Complaint  Patient presents with   Left Foot - Follow-up   HPI: Allison Wright is a pleasant 58 y.o. female who presents today for  follow-up of left heel pain.   Last week we did proceed with her first trial of extracorporeal shockwave therapy for the distal Achilles as well as her calcaneal spur.  She feels like she is at least 85% better after this last treatment and in general is feeling great and excited about her progress.  She continues her home exercises and stretching.  Does continue in her heel lifts which she finds comfortable to offload Achilles.  Pertinent ROS were reviewed with the patient and found to be negative unless otherwise specified above in HPI.   Assessment & Plan: Visit Diagnoses:  1. Achilles tendinitis, left leg   2. Calcaneal spur of foot, left    Plan: Impression is nearly improved left posterior heel pain with both distal Achilles tendinitis and some calcaneal spurring with insertional enthesophytes.  We have performed 2 treatments of extracorporeal shockwave therapy and she is at least 85% better, she is very happy with this progress.  We did repeat our last treatment of extracorporeal shockwave therapy today.  At this point I would like to take a 4 to 6-week holiday and let her continue her home exercises and stretching as indicated.  She may continue in her heel lifts to help offload the Achilles, when she has had full pain resolution, she may continue these for 2 additional weeks before then transitioning out and back to regular shoes.  We will get her back to Dr. Lajoyce Corners for follow-up as he initially referred her to me.  I am happy to see her back at any point going forward.  Follow-up: F/u with Dr. Lajoyce Corners in next 2-3 weeks; Shon Baton PRN   Meds & Orders: No orders of  the defined types were placed in this encounter.  No orders of the defined types were placed in this encounter.    Procedures: Procedure: ECSWT Indications:  Achilles tendinitis, Calcanel spur   Procedure Details Consent: Risks of procedure as well as the alternatives and risks of each were explained to the patient.  Verbal consent for procedure obtained. Time Out: Verified patient identification, verified procedure, site was marked, verified correct patient position. The area was cleaned with alcohol swab.     The left achilles and calcaneus was targeted for Extracorporeal shockwave therapy.    Preset: Achillodynia/Heel spur Power Level: 100 mJ Frequency: 11 Hz Impulse/cycles: 2400 Head size: Regular   Patient tolerated procedure well without immediate complications.       Clinical History: No specialty comments available.  She reports that she quit smoking about 27 years ago. Her smoking use included cigarettes. She started smoking about 37 years ago. She has a 5 pack-year smoking history. She has been exposed to tobacco smoke. She has never used smokeless tobacco.  Recent Labs    02/06/23 1614 06/07/23 1044  HGBA1C 6.4* 6.3*    Objective:    Physical Exam  Gen: Well-appearing, in no acute distress; non-toxic CV: Well-perfused. Warm.  Resp: Breathing unlabored on room air; no wheezing. Psych: Fluid speech in conversation; appropriate affect; normal thought process  Ortho Exam - Left ankle/foot: There is no significant tenderness about the Achilles  over the superior calcaneus.  There is good range of motion with plantarflexion and dorsiflexion.  Imaging: No results found.  Past Medical/Family/Surgical/Social History: Medications & Allergies reviewed per EMR, new medications updated. Patient Active Problem List   Diagnosis Date Noted   Hyperlipidemia 06/06/2022   Overactive bladder 01/28/2020   Dizziness 01/30/2018   Stress fracture of left calcaneus 08/28/2017    Prediabetes 10/12/2016   Overweight 10/12/2016   Systolic ejection murmur 10/09/2016   Past Medical History:  Diagnosis Date   Allergy    Arthritis    Heart murmur    Post-menopause    Vertigo    Family History  Problem Relation Age of Onset   Cirrhosis Mother    Alcohol abuse Mother    Diabetes Father 78   Heart attack Father    Heart disease Paternal Grandmother    Past Surgical History:  Procedure Laterality Date   ABDOMINAL HYSTERECTOMY  2001   Retained cervix   Social History   Occupational History   Not on file  Tobacco Use   Smoking status: Former    Current packs/day: 0.00    Average packs/day: 0.5 packs/day for 10.0 years (5.0 ttl pk-yrs)    Types: Cigarettes    Start date: 05/29/1986    Quit date: 05/29/1996    Years since quitting: 27.2    Passive exposure: Past   Smokeless tobacco: Never  Substance and Sexual Activity   Alcohol use: No    Alcohol/week: 0.0 standard drinks of alcohol   Drug use: No   Sexual activity: Never

## 2023-08-21 NOTE — Progress Notes (Signed)
 Patient says that she is feeling great. She thinks she is about 85% better. She does her exercises and keeps the ankle moving anytime she is seated which seems to help as well.

## 2023-09-04 ENCOUNTER — Other Ambulatory Visit: Payer: Self-pay

## 2023-09-04 ENCOUNTER — Ambulatory Visit: Admitting: Orthopedic Surgery

## 2023-09-04 DIAGNOSIS — M7662 Achilles tendinitis, left leg: Secondary | ICD-10-CM

## 2023-09-04 DIAGNOSIS — M79672 Pain in left foot: Secondary | ICD-10-CM

## 2023-09-05 ENCOUNTER — Encounter: Payer: Self-pay | Admitting: Orthopedic Surgery

## 2023-09-05 NOTE — Progress Notes (Signed)
 Office Visit Note   Patient: Allison Wright           Date of Birth: [birth year]           MRN: [id number] Visit Date: 09/04/2023              Requested by: Rema Fendt, NP [address] [place],  [place] [address] PCP: Rema Fendt, NP  Chief Complaint  Patient presents with   Left Heel - Follow-up      HPI: Patient is a 58 year old woman who is seen for evaluation for left heel pain.  She is status post shockwave therapy x 3.  She states that this did help.  She does have a follow-up with Dr. Shon Baton in 6 weeks.  She states she has pain over the heel with prolonged standing.  Assessment & Plan: Visit Diagnoses:  1. Pain in left foot   2. Achilles tendinitis, left leg     Plan: Patient was given instructions and demonstrated Achilles stretching.  Follow-Up Instructions: No follow-ups on file.   Ortho Exam  Patient is alert, oriented, no adenopathy, well-dressed, normal affect, normal respiratory effort. Examination she has mild tenderness to palpation along the Achilles.  There are no nodules no palpable defects no swelling.  With her knee extended she has dorsiflexion only to neutral with Achilles contracture.  Imaging: No results found. No images are attached to the encounter.  Labs: Lab Results  Component Value Date   HGBA1C 6.3 (H) 06/07/2023   HGBA1C 6.4 (H) 02/06/2023   HGBA1C 6.1 (H) 06/05/2022     Lab Results  Component Value Date   ALBUMIN 4.2 06/07/2023   ALBUMIN 4.3 03/19/2023   ALBUMIN 4.3 08/24/2022    No results found for: "MG" Lab Results  Component Value Date   VD25OH 35.3 10/15/2019   VD25OH 29.0 (L) 09/23/2018   VD25OH 32.3 07/31/2018    No results found for: "PREALBUMIN"    Latest Ref Rng & Units 06/05/2022    3:07 PM 08/17/2021    6:28 PM 11/01/2020    3:53 PM  CBC EXTENDED  WBC 3.4 - 10.8 x10E3/uL 9.1  10.1  9.6   RBC 3.77 - 5.28 x10E6/uL 4.35  3.95  4.44   Hemoglobin 11.1 - 15.9 g/dL 19.1  47.8  29.5   HCT  34.0 - 46.6 % 38.2  36.2  40.1   Platelets 150 - 450 x10E3/uL 366  329  372   NEUT# 1.7 - 7.7 K/uL  5.7    Lymph# 0.7 - 4.0 K/uL  3.5       There is no height or weight on file to calculate BMI.  Orders:  No orders of the defined types were placed in this encounter.  No orders of the defined types were placed in this encounter.    Procedures: No procedures performed  Clinical Data: No additional findings.  ROS:  All other systems negative, except as noted in the HPI. Review of Systems  Objective: Vital Signs: There were no vitals taken for this visit.  Specialty Comments:  No specialty comments available.  PMFS History: Patient Active Problem List   Diagnosis Date Noted   Hyperlipidemia 06/06/2022   Overactive bladder 01/28/2020   Dizziness 01/30/2018   Stress fracture of left calcaneus 08/28/2017   Prediabetes 10/12/2016   Overweight 10/12/2016   Systolic ejection murmur 10/09/2016   Past Medical History:  Diagnosis Date   Allergy    Arthritis  Heart murmur    Post-menopause    Vertigo     Family History  Problem Relation Age of Onset   Cirrhosis Mother    Alcohol abuse Mother    Diabetes Father 31   Heart attack Father    Heart disease Paternal Grandmother     Past Surgical History:  Procedure Laterality Date   ABDOMINAL HYSTERECTOMY  2001   Retained cervix   Social History   Occupational History   Not on file  Tobacco Use   Smoking status: Former    Current packs/day: 0.00    Average packs/day: 0.5 packs/day for 10.0 years (5.0 ttl pk-yrs)    Types: Cigarettes    Start date: 05/29/1986    Quit date: 05/29/1996    Years since quitting: 27.2    Passive exposure: Past   Smokeless tobacco: Never  Substance and Sexual Activity   Alcohol use: No    Alcohol/week: 0.0 standard drinks of alcohol   Drug use: No   Sexual activity: Never

## 2023-09-16 ENCOUNTER — Other Ambulatory Visit: Payer: Self-pay | Admitting: Family

## 2023-09-16 DIAGNOSIS — M5440 Lumbago with sciatica, unspecified side: Secondary | ICD-10-CM

## 2023-09-16 DIAGNOSIS — R200 Anesthesia of skin: Secondary | ICD-10-CM

## 2023-09-18 NOTE — Telephone Encounter (Signed)
 Complete

## 2023-09-25 ENCOUNTER — Ambulatory Visit: Admitting: Sports Medicine

## 2023-09-25 ENCOUNTER — Encounter: Payer: Self-pay | Admitting: Sports Medicine

## 2023-09-25 DIAGNOSIS — M7732 Calcaneal spur, left foot: Secondary | ICD-10-CM

## 2023-09-25 DIAGNOSIS — M7662 Achilles tendinitis, left leg: Secondary | ICD-10-CM | POA: Diagnosis not present

## 2023-09-25 NOTE — Progress Notes (Signed)
 Patient says that she is feeling much better. She says that she will feel the heel every once in awhile but she does her exercises and any discomfort goes away. She denies any new pain or symptoms in the time since her last visit.

## 2023-09-25 NOTE — Progress Notes (Signed)
 Allison Wright - 58 y.o. female MRN [id number]  Date of birth: 02-Jun-1965  Office Visit Note: Visit Date: 09/25/2023 PCP: Senaida Dama, NP Referred by: Senaida Dama, NP  Subjective: Chief Complaint  Patient presents with   Left Foot - Follow-up   HPI: Allison Wright is a pleasant 58 y.o. female who presents today for follow-up of left heel pain.  In total, she has had 3 treatments of extracorporeal shockwave therapy, her last was on 08/21/2023.  She feels like she is about 90% improved after these treatments as well as continuing her home exercises and Achilles stretching.  She does remain in her heel lifts and is using topical Voltaren gel only as needed.  She is much improved, only has a few days out of the week where she will feel discomfort.  This does correlate with prolonged standing, specifically standing on concrete for her job.  Pertinent ROS were reviewed with the patient and found to be negative unless otherwise specified above in HPI.   Assessment & Plan: Visit Diagnoses:  1. Achilles tendinitis, left leg   2. Calcaneal spur of foot, left    Plan: Impression is nearly resolved left posterior heel pain with distal Achilles tendinitis and insertional enthesophytes/spurring.  At this point, she is about 90% improved.  I would like her to continue her home exercises and stretching regimen at least 1 time daily for the next 1 month.  She will continue in her heel lifts to help offload the Achilles, when she is having no pain on a daily basis with ADLs x 2 weeks, she may transition out of her heel lifts.  I would continue to expect further cumulative benefit from prior shockwave and her HEP over the next 1 month.  After 1 month if she is plateaued or her pain is reoccurring, she may present and we may consider a few additional shockwave treatment therapies.  Otherwise, follow-up as needed.  Follow-up: Return in about 1 month (around 10/25/2023), or if symptoms worsen or fail to  improve.   Meds & Orders: No orders of the defined types were placed in this encounter.  No orders of the defined types were placed in this encounter.    Procedures: No procedures performed      Clinical History: No specialty comments available.  She reports that she quit smoking about 27 years ago. Her smoking use included cigarettes. She started smoking about 37 years ago. She has a 5 pack-year smoking history. She has been exposed to tobacco smoke. She has never used smokeless tobacco.  Recent Labs    02/06/23 1614 06/07/23 1044  HGBA1C 6.4* 6.3*    Objective:    Physical Exam  Gen: Well-appearing, in no acute distress; non-toxic CV: Well-perfused. Warm.  Resp: Breathing unlabored on room air; no wheezing. Psych: Fluid speech in conversation; appropriate affect; normal thought process  Ortho Exam - Left heel/foot/ankle: Nonantalgic gait.  Imaging: No results found.  Past Medical/Family/Surgical/Social History: Medications & Allergies reviewed per EMR, new medications updated. Patient Active Problem List   Diagnosis Date Noted   Hyperlipidemia 06/06/2022   Overactive bladder 01/28/2020   Dizziness 01/30/2018   Stress fracture of left calcaneus 08/28/2017   Prediabetes 10/12/2016   Overweight 10/12/2016   Systolic ejection murmur 10/09/2016   Past Medical History:  Diagnosis Date   Allergy    Arthritis    Heart murmur    Post-menopause    Vertigo    Family History  Problem  Relation Age of Onset   Cirrhosis Mother    Alcohol abuse Mother    Diabetes Father 33   Heart attack Father    Heart disease Paternal Grandmother    Past Surgical History:  Procedure Laterality Date   ABDOMINAL HYSTERECTOMY  2001   Retained cervix   Social History   Occupational History   Not on file  Tobacco Use   Smoking status: Former    Current packs/day: 0.00    Average packs/day: 0.5 packs/day for 10.0 years (5.0 ttl pk-yrs)    Types: Cigarettes    Start date:  05/29/1986    Quit date: 05/29/1996    Years since quitting: 27.3    Passive exposure: Past   Smokeless tobacco: Never  Substance and Sexual Activity   Alcohol use: No    Alcohol/week: 0.0 standard drinks of alcohol   Drug use: No   Sexual activity: Never

## 2023-09-26 DIAGNOSIS — R350 Frequency of micturition: Secondary | ICD-10-CM | POA: Diagnosis not present

## 2023-09-26 DIAGNOSIS — N3941 Urge incontinence: Secondary | ICD-10-CM | POA: Diagnosis not present

## 2023-09-30 IMAGING — CT CT HEAD W/O CM
4 series · 16 of 47 positions shown, 18 images · non-contrast
Comparison: None.

CLINICAL DATA: Headache, new or worsening (Age >= 50y)



[Series 3: head wo · axial · 0.43mm/px · z∈[-142,-22]mm · 7 of 33 slices shown, 9 images]
[im 5/33  brain]
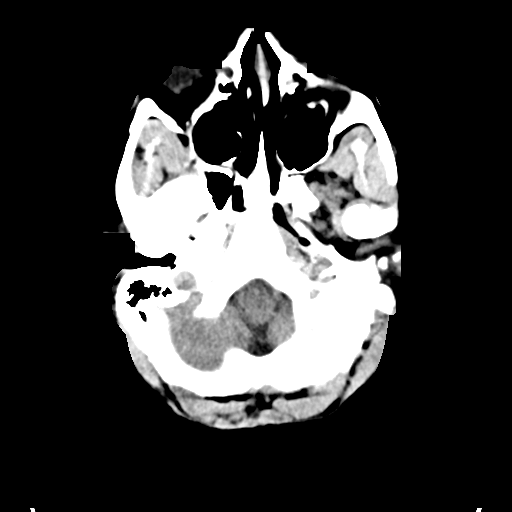
[im 5/33  bone]
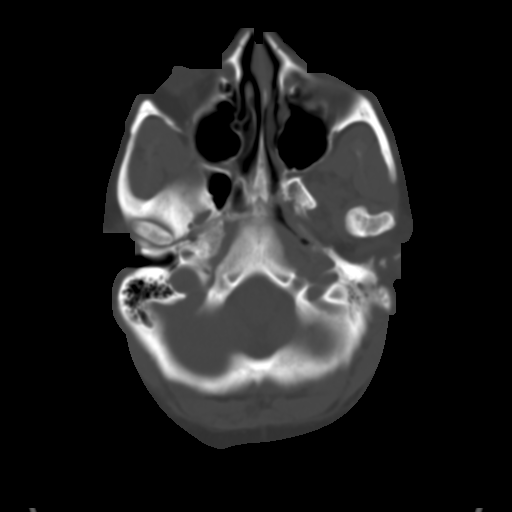
[im 9/33  brain]
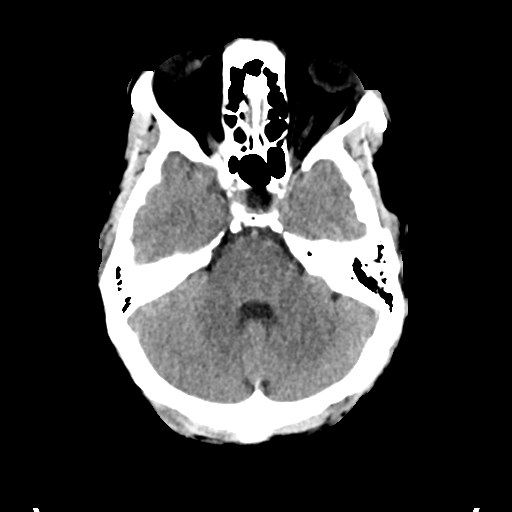
[im 13/33  brain]
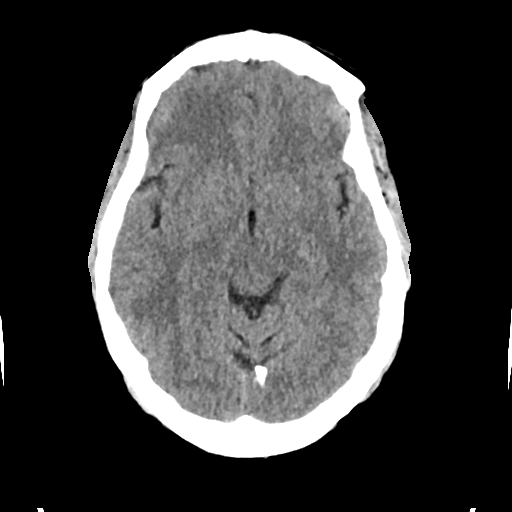
[im 17/33  brain]
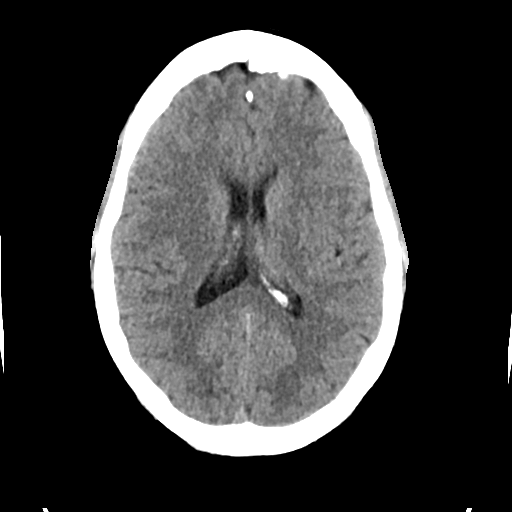
[im 21/33  brain]
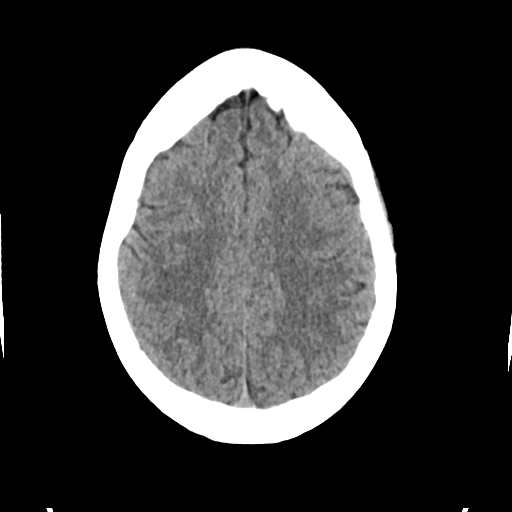
[im 21/33  bone]
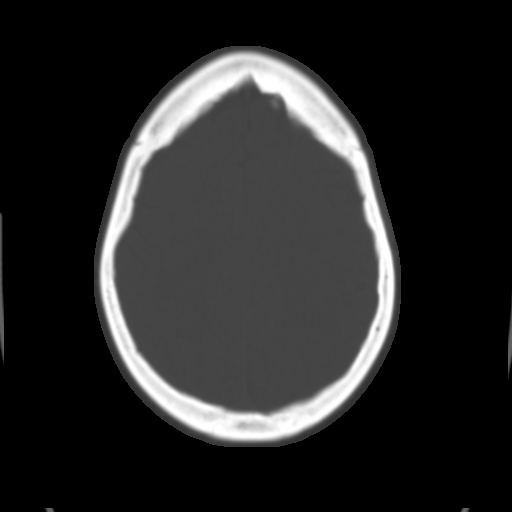
[im 25/33  brain]
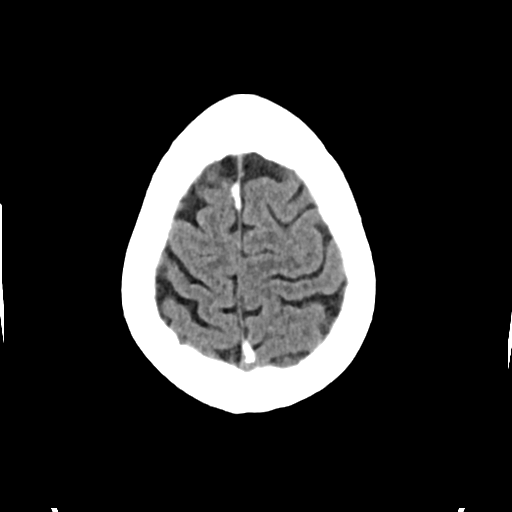
[im 29/33  brain]
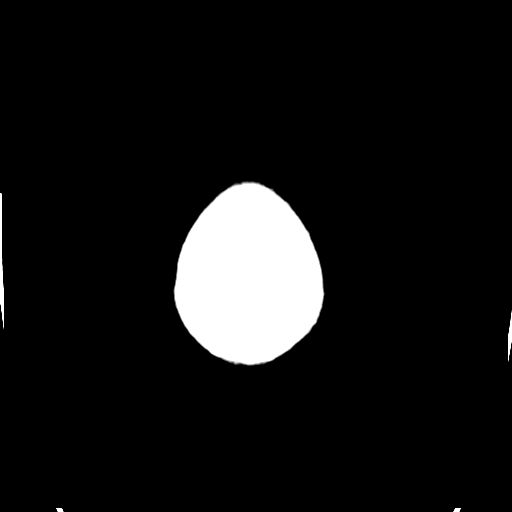

[Series 4: head bone · axial · 0.43mm/px · z∈[-146,-114]mm · 3 of 82 slices shown]
[im 9/82  bone]
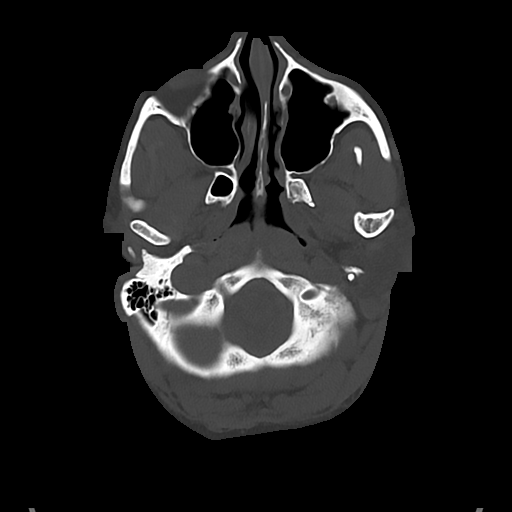
[im 17/82  bone]
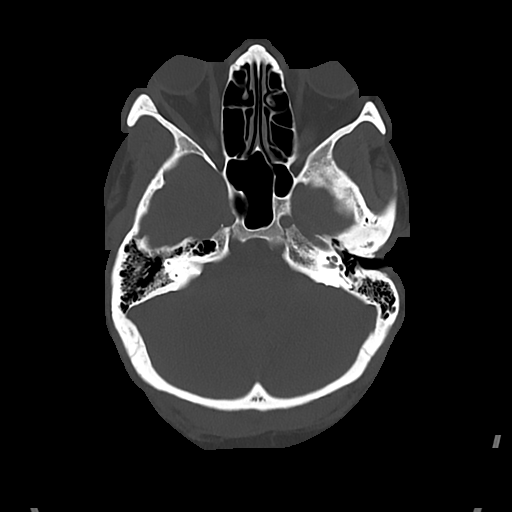
[im 25/82  bone]
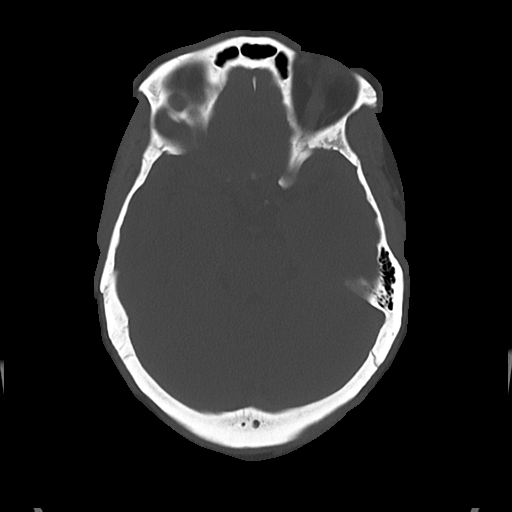

[Series 5: cor soft · coronal · 0.31mm/px · 3 of 71 slices shown]
[im 24/71  brain]
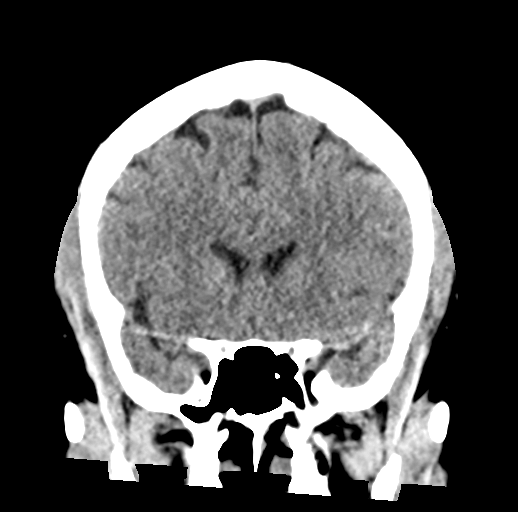
[im 32/71  brain]
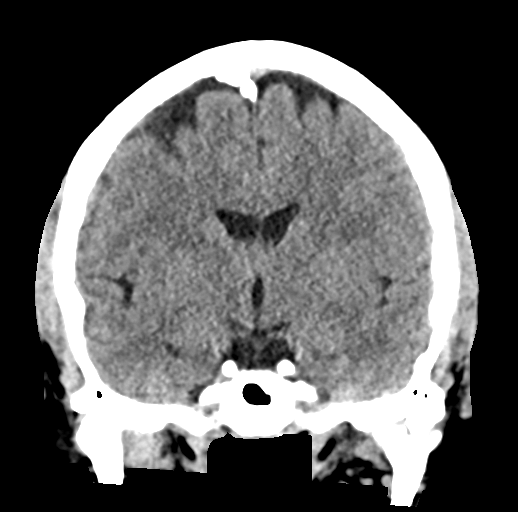
[im 39/71  brain]
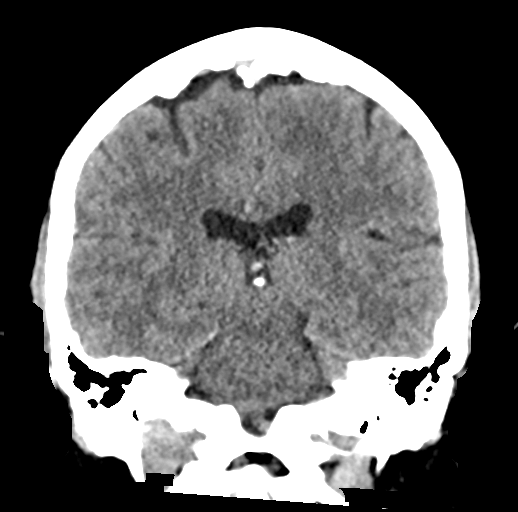

[Series 6: sag soft · sagittal · 0.30mm/px · 3 of 58 slices shown]
[im 20/58  brain]
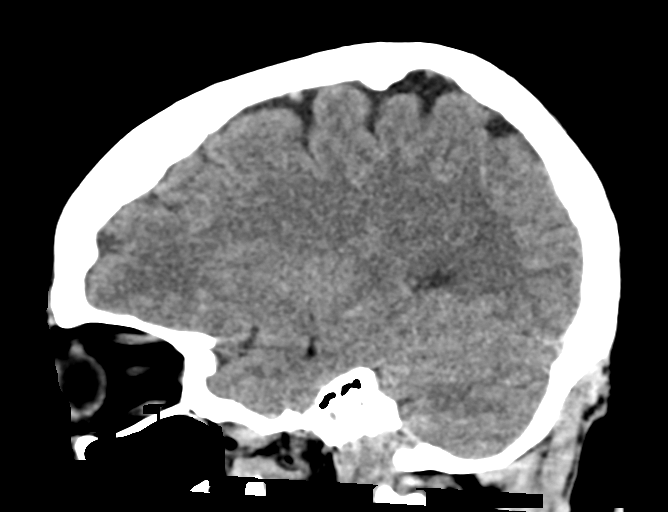
[im 29/58  brain]
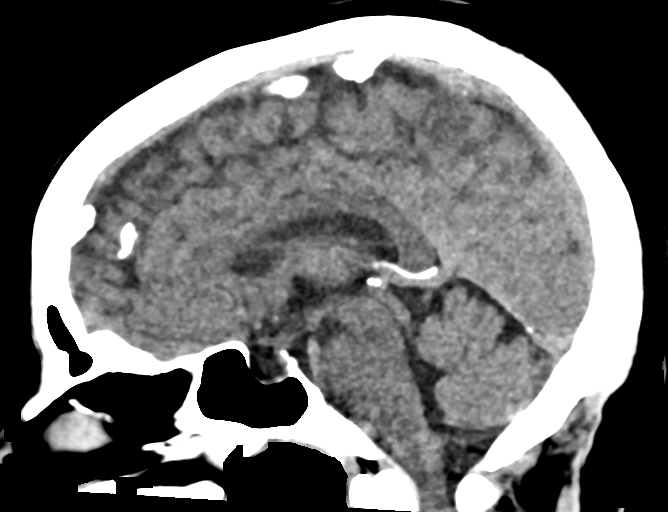
[im 39/58  brain]
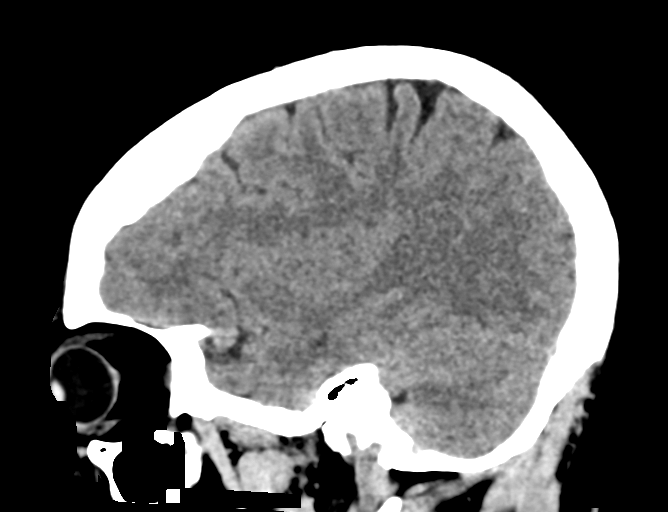

[16 of 47 positions shown; findings below may reference images not displayed]

FINDINGS: Brain:

No evidence of large-territorial acute infarction. No parenchymal
hemorrhage. No mass lesion. No extra-axial collection.

No mass effect or midline shift. No hydrocephalus. Basilar cisterns
are patent.

Empty sella.

Vascular: No hyperdense vessel.

Skull: No acute fracture or focal lesion.

Sinuses/Orbits: Paranasal sinuses and mastoid air cells are clear.
The orbits are unremarkable.

Other: None.
IMPRESSION: 1. No acute intracranial abnormality.
2. Empty sella. Findings is often a normal anatomic variant but can
be associated with idiopathic intracranial hypertension (pseudotumor
cerebri).

## 2023-10-29 ENCOUNTER — Encounter: Payer: Self-pay | Admitting: Emergency Medicine

## 2023-10-29 ENCOUNTER — Ambulatory Visit: Payer: Self-pay

## 2023-10-29 ENCOUNTER — Ambulatory Visit
Admission: EM | Admit: 2023-10-29 | Discharge: 2023-10-29 | Disposition: A | Discharge status: Home / Self Care | Attending: Physician Assistant | Admitting: Physician Assistant

## 2023-10-29 DIAGNOSIS — K5900 Constipation, unspecified: Secondary | ICD-10-CM | POA: Insufficient documentation

## 2023-10-29 DIAGNOSIS — G5602 Carpal tunnel syndrome, left upper limb: Secondary | ICD-10-CM | POA: Diagnosis not present

## 2023-10-29 DIAGNOSIS — Z860100 Personal history of colon polyps, unspecified: Secondary | ICD-10-CM | POA: Insufficient documentation

## 2023-10-29 MED ORDER — PREDNISONE 20 MG PO TABS: 40.0000 mg | ORAL_TABLET | Freq: Every day | ORAL | 0 refills | Status: AC

## 2023-10-29 NOTE — Telephone Encounter (Signed)
  Chief Complaint: Hand pain - numbness Symptoms: above Frequency: ongoing - getting worse Pertinent Negatives: Patient denies fever, redness Disposition: [] ED /[x] Urgent Care (no appt availability in office) / [] Appointment(In office/virtual)/ []  [facility]/ [] Home Care/ [] Refused Recommended Disposition /[] [facility]/ []  Follow-up with PCP Additional Notes: Pt has had hand pain ongoing - currently takes gabapentin . Pt states gabapentin  is not helpful. Pt reports a few instances where pain was very intense. Pt is taking IBU for pain as needed. Pain come and goes and may be d/t repeated hand movements. Pt will go to UC in the next few days, Follow up appt scheduled for July 1st.    Copied from CRM #[id number]. Topic: Clinical - Red Word Triage >> Oct 29, 2023 10:34 AM Ethelle Herb L wrote: Red Word that prompted transfer to Nurse Triage: tingling and pain in left hand Reason for Disposition  [1] Weakness or numbness in hand or fingers AND [2] present > 2 weeks  Answer Assessment - Initial Assessment Questions 1. ONSET: "When did the pain start?"     Comes and goes  - last month more often. Last year 2. LOCATION: "Where is the pain located?"     Left hand 3. PAIN: "How bad is the pain?" (Scale 1-10; or mild, moderate, severe)   - MILD (1-3): doesn't interfere with normal activities   - MODERATE (4-7): interferes with normal activities (e.g., work or school) or awakens from sleep   - SEVERE (8-10): excruciating pain, unable to use hand at all     8/10 4. WORK OR EXERCISE: "Has there been any recent work or exercise that involved this part (i.e., hand or wrist) of the body?"     unsure 5. CAUSE: "What do you think is causing the pain?"     unsure 6. AGGRAVATING FACTORS: "What makes the pain worse?" (e.g., using computer)     Gets worse with use.  7. OTHER SYMPTOMS: "Do you have any other symptoms?" (e.g., neck pain, swelling, rash, numbness, fever)     No swelling - no  redness  Protocols used: Hand and Wrist Pain-A-AH

## 2023-10-29 NOTE — ED Triage Notes (Addendum)
 Pt reports increased L hand pain (aching) and numbness/tingling x38month. Recurrent hx over the last year, but has gotten much worse. Was put on gabapentin  ~21yr ago for bilateral hand numbness & LBP w/ sciatica. Pt notes the area that bothers her most is her palm area just underneath her fingers. No known hx of carpal tunnel. Has appt with PCP 07/01 (Amy stephens). No relief with ibuprofen  and gabapentin .

## 2023-10-29 NOTE — Telephone Encounter (Signed)
 Report to Emergency Department/Urgent Care/call 911 for immediate medical evaluation. Follow-up with Primary Care.

## 2023-10-30 NOTE — ED Provider Notes (Signed)
 [facility]    CSN: [id number] Arrival date & time: 10/29/23  1706      History   Chief Complaint Chief Complaint  Patient presents with   Hand Pain   Numbness    HPI Allison Wright is a 58 y.o. female.   Patient presents today for evaluation of left hand aching hand pain and numbness and tingling that is been reoccurring over the last month.  She reports that symptoms are intermittent and occur at night often.  She states she has had similar symptoms in the past and was put on gabapentin  for hand numbness and sciatica.  She notes that typically she is having pain to her palm area underneath her fingers.  She has no known diagnosis of carpal tunnel but does use her hands and wrist often for work.  She reports she has tried ibuprofen  as well as the gabapentin  for current symptoms without resolution.  She denies any known injury.  The history is provided by the patient.  Hand Pain Pertinent negatives include no abdominal pain and no shortness of breath.    Past Medical History:  Diagnosis Date   Allergy    Arthritis    Heart murmur    Post-menopause    Vertigo     Patient Active Problem List   Diagnosis Date Noted   Constipation 10/29/2023   History of colon polyps 10/29/2023   Hyperlipidemia 06/06/2022   Overactive bladder 01/28/2020   Dizziness 01/30/2018   Stress fracture of left calcaneus 08/28/2017   Prediabetes 10/12/2016   Overweight 10/12/2016   Systolic ejection murmur 10/09/2016    Past Surgical History:  Procedure Laterality Date   ABDOMINAL HYSTERECTOMY  2001   Retained cervix    OB History   No obstetric history on file.      Home Medications    Prior to Admission medications   Medication Sig Start Date End Date Taking? Authorizing Provider  atorvastatin  (LIPITOR) 80 MG tablet Take 1 tablet (80 mg total) by mouth daily. 01/17/23  Yes Elmyra Haggard, MD  Cholecalciferol (VITAMIN D -3) 25 MCG (1000 UT) CAPS 2 capsules Orally Once a day    Yes [provider]  gabapentin  (NEURONTIN ) 300 MG capsule TAKE 1 CAPSULE BY MOUTH THREE TIMES A DAY 09/18/23  Yes Rogerio Clay, Amy J, NP  ibuprofen  (ADVIL ) 600 MG tablet Take 600 mg by mouth every 6 (six) hours as needed for mild pain or moderate pain.   Yes [provider]  loratadine (CLARITIN) 10 MG tablet Take 1 tablet by mouth daily.   Yes [provider]  predniSONE (DELTASONE) 20 MG tablet Take 2 tablets (40 mg total) by mouth daily with breakfast for 5 days. 10/29/23 11/03/23 Yes Vernestine Gondola, PA-C  tolterodine (DETROL LA) 4 MG 24 hr capsule Take 4 mg by mouth daily. 09/25/20  Yes [provider]  Bempedoic Acid-Ezetimibe (NEXLIZET ) 180-10 MG TABS Take 1 tablet by mouth daily. Patient not taking: Reported on 10/29/2023 01/17/23   Elmyra Haggard, MD  diclofenac Sodium (VOLTAREN) 1 % GEL APPLY 2-4 GRAM TO AFFECTED AREA FOUR TIMES A DAY AS NEEDED FOR PAIN Patient not taking: Reported on 10/29/2023 11/07/21   [provider]  fluticasone  (FLONASE ) 50 MCG/ACT nasal spray Place 2 sprays into the nose. Patient not taking: Reported on 10/29/2023 10/15/19   [provider]  meclizine  (ANTIVERT ) 12.5 MG tablet Take 12.5 mg by mouth. Patient not taking: Reported on 10/29/2023 10/15/19   [provider]  meloxicam  (MOBIC ) 7.5 MG tablet TAKE 1 TABLET BY MOUTH EVERY DAY Patient not taking: Reported on 10/29/2023 05/13/20   Benjiman Bras, MD  tolterodine (DETROL) 1 MG tablet 1 tablet Orally once a day Patient not taking: Reported on 10/29/2023    [provider]    Family History Family History  Problem Relation Age of Onset   Cirrhosis Mother    Alcohol abuse Mother    Diabetes Father 60   Heart attack Father    Heart disease Paternal Grandmother     Social History Social History   Tobacco Use   Smoking status: Former    Current packs/day: 0.00    Average packs/day: 0.5 packs/day for 10.0 years (5.0 ttl pk-yrs)    Types:  Cigarettes    Start date: 05/29/1986    Quit date: 05/29/1996    Years since quitting: 27.4    Passive exposure: Past   Smokeless tobacco: Never  Vaping Use   Vaping status: Never Used  Substance Use Topics   Alcohol use: No    Alcohol/week: 0.0 standard drinks of alcohol   Drug use: No     Allergies   Other   Review of Systems Review of Systems  Constitutional:  Negative for chills and fever.  Eyes:  Negative for discharge and redness.  Respiratory:  Negative for shortness of breath.   Gastrointestinal:  Negative for abdominal pain, nausea and vomiting.  Musculoskeletal:  Negative for joint swelling.  Skin:  Negative for color change.  Neurological:  Positive for numbness.     Physical Exam Triage Vital Signs ED Triage Vitals [10/29/23 1725]  Encounter Vitals Group     BP 135/83     Systolic BP Percentile      Diastolic BP Percentile      Pulse Rate 68     Resp 16     Temp 98.5 F (36.9 C)     Temp Source Oral     SpO2 96 %     Weight      Height      Head Circumference      Peak Flow      Pain Score 4     Pain Loc      Pain Education      Exclude from Growth Chart    No data found.  Updated Vital Signs BP 135/83 (BP Location: Left Arm)   Pulse 68   Temp 98.5 F (36.9 C) (Oral)   Resp 16   SpO2 96%   Visual Acuity Right Eye Distance:   Left Eye Distance:   Bilateral Distance:    Right Eye Near:   Left Eye Near:    Bilateral Near:     Physical Exam Vitals and nursing note reviewed.  Constitutional:      General: She is not in acute distress.    Appearance: Normal appearance. She is not ill-appearing.  HENT:     Head: Normocephalic and atraumatic.  Eyes:     Conjunctiva/sclera: Conjunctivae normal.  Cardiovascular:     Rate and Rhythm: Normal rate.  Pulmonary:     Effort: Pulmonary effort is normal. No respiratory distress.  Musculoskeletal:     Comments: Positive phalen's test to left  Neurological:     Mental Status: She is alert.   Psychiatric:        Mood and Affect: Mood normal.        Behavior: Behavior normal.  Thought Content: Thought content normal.      UC Treatments / Results  Labs (all labs ordered are listed, but only abnormal results are displayed) Labs Reviewed - No data to display  EKG   Radiology No results found.  Procedures Procedures (including critical care time)  Medications Ordered in UC Medications - No data to display  Initial Impression / Assessment and Plan / UC Course  I have reviewed the triage vital signs and the nursing notes.  Pertinent labs & imaging results that were available during my care of the patient were reviewed by me and considered in my medical decision making (see chart for details).    Symptoms most consistent with carpal tunnel.  Will treat with low-dose steroid but discussed she would ultimately need follow-up with orthopedics.  She does note that she has established with Ortho due to foot issues.  Advised she follow-up with them in the near future.  Patient expressed understanding.  Final Clinical Impressions(s) / UC Diagnoses   Final diagnoses:  Carpal tunnel syndrome of left wrist   Discharge Instructions   None    ED Prescriptions     Medication Sig Dispense Auth. Provider   predniSONE (DELTASONE) 20 MG tablet Take 2 tablets (40 mg total) by mouth daily with breakfast for 5 days. 10 tablet Vernestine Gondola, PA-C      PDMP not reviewed this encounter.   Vernestine Gondola, PA-C 10/30/23 1720

## 2023-10-31 NOTE — Telephone Encounter (Signed)
 I have attempted without success to contact this patient by phone to return their call and I left a message on answering machine.

## 2023-11-27 ENCOUNTER — Encounter: Payer: Self-pay | Admitting: Family

## 2023-11-27 ENCOUNTER — Ambulatory Visit: Payer: Self-pay | Admitting: Family

## 2023-11-27 ENCOUNTER — Ambulatory Visit: Admitting: Family

## 2023-11-27 VITALS — BP 122/73 | HR 66 | Temp 98.0°F | Resp 16 | Ht 64.0 in | Wt 232.4 lb

## 2023-11-27 DIAGNOSIS — R7303 Prediabetes: Secondary | ICD-10-CM

## 2023-11-27 DIAGNOSIS — G5602 Carpal tunnel syndrome, left upper limb: Secondary | ICD-10-CM

## 2023-11-27 LAB — POCT GLYCOSYLATED HEMOGLOBIN (HGB A1C): Hemoglobin A1C: 6.1 % — AB (ref 4.0–5.6)

## 2023-11-27 MED ORDER — GABAPENTIN 300 MG PO CAPS: 300.0000 mg | ORAL_CAPSULE | Freq: Four times a day (QID) | ORAL | 0 refills | Status: DC

## 2023-11-27 NOTE — Progress Notes (Signed)
 Patient ID: Allison Wright, female    DOB: 05-15-1966  MRN: [id number]  CC: Urgent Care Follow-Up  Subjective: Allison Wright is a 58 y.o. female who presents for Urgent Care follow-up.  Her concerns today include:  - Patient recently seen on 10/29/2023 at [facility]) for carpal tunnel left wrist. States Prednisone  helped with pain but still has tingling. She is taking Gabapentin  three times daily with minimal relief. She is using a carpal tunnel glove. - Prediabetes follow-up.  Patient Active Problem List   Diagnosis Date Noted   Constipation 10/29/2023   History of colon polyps 10/29/2023   Hyperlipidemia 06/06/2022   Overactive bladder 01/28/2020   Dizziness 01/30/2018   Stress fracture of left calcaneus 08/28/2017   Prediabetes 10/12/2016   Overweight 10/12/2016   Systolic ejection murmur 10/09/2016     Current Outpatient Medications on File Prior to Visit  Medication Sig Dispense Refill   atorvastatin  (LIPITOR) 80 MG tablet Take 1 tablet (80 mg total) by mouth daily. 90 tablet 3   Cholecalciferol (VITAMIN D -3) 25 MCG (1000 UT) CAPS 2 capsules Orally Once a day     ibuprofen  (ADVIL ) 600 MG tablet Take 600 mg by mouth every 6 (six) hours as needed for mild pain or moderate pain.     loratadine (CLARITIN) 10 MG tablet Take 1 tablet by mouth daily.     tolterodine (DETROL LA) 4 MG 24 hr capsule Take 4 mg by mouth daily.     Bempedoic Acid-Ezetimibe (NEXLIZET ) 180-10 MG TABS Take 1 tablet by mouth daily. (Patient not taking: Reported on 10/29/2023) 90 tablet 3   diclofenac Sodium (VOLTAREN) 1 % GEL APPLY 2-4 GRAM TO AFFECTED AREA FOUR TIMES A DAY AS NEEDED FOR PAIN (Patient not taking: Reported on 10/29/2023)     fluticasone  (FLONASE ) 50 MCG/ACT nasal spray Place 2 sprays into the nose. (Patient not taking: Reported on 10/29/2023)     meclizine  (ANTIVERT ) 12.5 MG tablet Take 12.5 mg by mouth. (Patient not taking: Reported on 10/29/2023)      meloxicam  (MOBIC ) 7.5 MG tablet TAKE 1 TABLET BY MOUTH EVERY DAY (Patient not taking: Reported on 10/29/2023) 20 tablet 0   tolterodine (DETROL) 1 MG tablet 1 tablet Orally once a day (Patient not taking: Reported on 10/29/2023)     No current facility-administered medications on file prior to visit.    Allergies  Allergen Reactions   Other     Seasonal    Social History   Socioeconomic History   Marital status: Single    Spouse name: Not on file   Number of children: Not on file   Years of education: Not on file   Highest education level: Not on file  Occupational History   Not on file  Tobacco Use   Smoking status: Former    Current packs/day: 0.00    Average packs/day: 0.5 packs/day for 10.0 years (5.0 ttl pk-yrs)    Types: Cigarettes    Start date: 05/29/1986    Quit date: 05/29/1996    Years since quitting: 27.5    Passive exposure: Past   Smokeless tobacco: Never  Vaping Use   Vaping status: Never Used  Substance and Sexual Activity   Alcohol use: No    Alcohol/week: 0.0 standard drinks of alcohol   Drug use: No   Sexual activity: Never  Other Topics Concern   Not on file  Social History Narrative   Right handed   Caffeine-rarely  usese    Social Drivers of Corporate investment banker Strain: Low Risk  (03/06/2023)   Overall Financial Resource Strain (CARDIA)    Difficulty of Paying Living Expenses: Not very hard  Food Insecurity: No Food Insecurity (03/06/2023)   Hunger Vital Sign    Worried About Running Out of Food in the Last Year: Never true    Ran Out of Food in the Last Year: Never true  Transportation Needs: No Transportation Needs (03/06/2023)   PRAPARE - Administrator, Civil Service (Medical): No    Lack of Transportation (Non-Medical): No  Physical Activity: Inactive (03/06/2023)   Exercise Vital Sign    Days of Exercise per Week: 0 days    Minutes of Exercise per Session: 0 min  Stress: No Stress Concern Present (03/06/2023)   [employer] of Occupational Health - Occupational Stress Questionnaire    Feeling of Stress : Not at all  Social Connections: Socially Integrated (03/06/2023)   Social Connection and Isolation Panel    Frequency of Communication with Friends and Family: Three times a week    Frequency of Social Gatherings with Friends and Family: Three times a week    Attends Religious Services: More than 4 times per year    Active Member of Clubs or Organizations: No    Attends Banker Meetings: More than 4 times per year    Marital Status: Living with partner  Intimate Partner Violence: Not At Risk (03/06/2023)   Humiliation, Afraid, Rape, and Kick questionnaire    Fear of Current or Ex-Partner: No    Emotionally Abused: No    Physically Abused: No    Sexually Abused: No    Family History  Problem Relation Age of Onset   Cirrhosis Mother    Alcohol abuse Mother    Diabetes Father 25   Heart attack Father    Heart disease Paternal Grandmother     Past Surgical History:  Procedure Laterality Date   ABDOMINAL HYSTERECTOMY  2001   Retained cervix    ROS: Review of Systems Negative except as stated above  PHYSICAL EXAM: BP 122/73   Pulse 66   Temp 98 F (36.7 C) (Oral)   Resp 16   Ht 5' 4 (1.626 m)   Wt 232 lb 6.4 oz (105.4 kg)   SpO2 94%   BMI 39.89 kg/m   Physical Exam HENT:     Head: Normocephalic and atraumatic.     Nose: Nose normal.     Mouth/Throat:     Mouth: Mucous membranes are moist.     Pharynx: Oropharynx is clear.   Eyes:     Extraocular Movements: Extraocular movements intact.     Conjunctiva/sclera: Conjunctivae normal.     Pupils: Pupils are equal, round, and reactive to light.    Cardiovascular:     Rate and Rhythm: Normal rate and regular rhythm.     Pulses: Normal pulses.     Heart sounds: Normal heart sounds.  Pulmonary:     Effort: Pulmonary effort is normal.     Breath sounds: Normal breath sounds.   Musculoskeletal:        General:  Normal range of motion.     Right shoulder: Normal.     Left shoulder: Normal.     Right upper arm: Normal.     Left upper arm: Normal.     Right elbow: Normal.     Left elbow: Normal.     Right  forearm: Normal.     Left forearm: Normal.     Right wrist: Normal.     Left wrist: Normal.     Right hand: Normal.     Left hand: Normal.     Cervical back: Normal range of motion and neck supple.   Neurological:     General: No focal deficit present.     Mental Status: She is alert and oriented to person, place, and time.   Psychiatric:        Mood and Affect: Mood normal.        Behavior: Behavior normal.    ASSESSMENT AND PLAN: 1. Carpal tunnel syndrome of left wrist (Primary) - Increase Gabapentin  from 300 mg three times daily to 300 mg four times daily as prescribed. Counseled on medication adherence/adverse effects.  - Referral to Neurology for evaluation/management.  - Follow-up with primary provider as scheduled. - gabapentin  (NEURONTIN ) 300 MG capsule; Take 1 capsule (300 mg total) by mouth 4 (four) times daily.  Dispense: 360 capsule; Refill: 0 - Ambulatory referral to Neurology  2. Prediabetes - Routine screening.  - POCT glycosylated hemoglobin (Hb A1C)    Patient was given the opportunity to ask questions.  Patient verbalized understanding of the plan and was able to repeat key elements of the plan. Patient was given clear instructions to go to Emergency Department or return to medical center if symptoms don't improve, worsen, or new problems develop.The patient verbalized understanding.   Orders Placed This Encounter  Procedures   Ambulatory referral to Neurology   POCT glycosylated hemoglobin (Hb A1C)     Requested Prescriptions   Signed Prescriptions Disp Refills   gabapentin  (NEURONTIN ) 300 MG capsule 360 capsule 0    Sig: Take 1 capsule (300 mg total) by mouth 4 (four) times daily.    Follow-up with primary provider as scheduled.  Greig JINNY Drones, NP

## 2023-11-27 NOTE — Progress Notes (Signed)
 Left hand pain and numbness,  patient did not injury it ,  medication is not working for it, patient went to urgent care

## 2024-01-13 NOTE — Progress Notes (Unsigned)
 [facility]  PATIENT: Allison Wright DOB: [birth year]  REFERRING DOCTOR OR PCP: Greig Drones, NP SOURCE: Patient, notes from urgent care, notes from Dr. Rush, imaging reports, CT scan personally reviewed.  _________________________________   HISTORICAL  CHIEF COMPLAINT:  Chief Complaint  Patient presents with   RM10/NEW PT    Pt is here Alone. Pt states that she has tingling and pins and needles feeling in her hands that started the middle of May. Pt states that her left hand will tremor. Pt states that she has nerve pain in her left elbow.     HISTORY OF PRESENT ILLNESS:  I had the pleasure of seeing your patient, Allison Wright, at [facility] for neurologic consultation regarding her hand pain.  She is a 58 year old woman who had been seen in the past by Dr. Rush for headaches.   She presented to the urgent care on 10/29/2023 with left hand numbness over the preceding month.  Symptoms are intermittent and can be worse at night.  She had similar symptoms in the past and was placed on gabapentin  but it is not helping now.  Addition of NSAIDs helps slightly    A steroid pack helped x 1-2 weeks.    She is noting tingling in both hands.   She has dropped items but no definite weakness.   She notes the tingling bothers her more if on a keyboard and sometimes awakesns her.  Numbness sometimes resolves with shaking or movements.    Each spell usually lasts up to 30 minutes.   The left is usually worse than the right hand.   She denies neck pain.    She denies weakness or numbness in her legs.   She takes tolterodine for stable urinary urgency.    Her headaches have done better since the severe headaches she had in 2024   She works at the [employer] in.   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    Imaging: CT scan of the head 08/17/2021 showed a partially empty sella turcica. It was otherwise normal for age.  REVIEW OF  SYSTEMS: Constitutional: No fevers, chills, sweats, or change in appetite Eyes: No visual changes, double vision, eye pain Ear, nose and throat: No hearing loss, ear pain, nasal congestion, sore throat Cardiovascular: No chest pain, palpitations Respiratory:  No shortness of breath at rest or with exertion.   No wheezes GastrointestinaI: No nausea, vomiting, diarrhea, abdominal pain, fecal incontinence Genitourinary:  No dysuria, urinary retention or frequency.  No nocturia. Musculoskeletal:  No neck pain, back pain Integumentary: No rash, pruritus, skin lesions Neurological: as above Psychiatric: No depression at this time.  No anxiety Endocrine: No palpitations, diaphoresis, change in appetite, change in weigh or increased thirst Hematologic/Lymphatic:  No anemia, purpura, petechiae. Allergic/Immunologic: No itchy/runny eyes, nasal congestion, recent allergic reactions, rashes  ALLERGIES: Allergies  Allergen Reactions   Other     Seasonal    HOME MEDICATIONS:  Current Outpatient Medications:    atorvastatin  (LIPITOR) 80 MG tablet, Take 1 tablet (80 mg total) by mouth daily., Disp: 90 tablet, Rfl: 3   Cholecalciferol (VITAMIN D -3) 25 MCG (1000 UT) CAPS, 2 capsules Orally Once a day, Disp: , Rfl:    diclofenac Sodium (VOLTAREN) 1 % GEL, APPLY 2-4 GRAM TO AFFECTED AREA FOUR TIMES A DAY AS NEEDED FOR PAIN, Disp: , Rfl:    fluticasone  (FLONASE ) 50 MCG/ACT nasal spray, Place 2 sprays into the nose., Disp: , Rfl:  gabapentin  (NEURONTIN ) 300 MG capsule, Take 1 capsule (300 mg total) by mouth 4 (four) times daily., Disp: 360 capsule, Rfl: 0   ibuprofen  (ADVIL ) 600 MG tablet, Take 600 mg by mouth every 6 (six) hours as needed for mild pain or moderate pain., Disp: , Rfl:    loratadine (CLARITIN) 10 MG tablet, Take 1 tablet by mouth daily., Disp: , Rfl:    meloxicam  (MOBIC ) 7.5 MG tablet, TAKE 1 TABLET BY MOUTH EVERY DAY, Disp: 20 tablet, Rfl: 0   rosuvastatin (CRESTOR) 10 MG tablet, Take  10 mg by mouth daily., Disp: , Rfl:    tolterodine (DETROL LA) 4 MG 24 hr capsule, Take 4 mg by mouth daily., Disp: , Rfl:    tolterodine (DETROL) 1 MG tablet, 1 tablet Orally once a day, Disp: , Rfl:    Bempedoic Acid-Ezetimibe (NEXLIZET ) 180-10 MG TABS, Take 1 tablet by mouth daily. (Patient not taking: Reported on 01/22/2024), Disp: 90 tablet, Rfl: 3   meclizine  (ANTIVERT ) 12.5 MG tablet, Take 12.5 mg by mouth. (Patient not taking: Reported on 01/22/2024), Disp: , Rfl:   PAST MEDICAL HISTORY: Past Medical History:  Diagnosis Date   Allergy    Arthritis    Heart murmur    Post-menopause    Vertigo     PAST SURGICAL HISTORY: Past Surgical History:  Procedure Laterality Date   ABDOMINAL HYSTERECTOMY  2001   Retained cervix    FAMILY HISTORY: Family History  Problem Relation Age of Onset   Cirrhosis Mother    Alcohol abuse Mother    Diabetes Father 77   Heart attack Father    Heart disease Paternal Grandmother     SOCIAL HISTORY: Social History   Socioeconomic History   Marital status: Single    Spouse name: Not on file   Number of children: Not on file   Years of education: Not on file   Highest education level: Not on file  Occupational History   Not on file  Tobacco Use   Smoking status: Former    Current packs/day: 0.00    Average packs/day: 0.5 packs/day for 10.0 years (5.0 ttl pk-yrs)    Types: Cigarettes    Start date: 05/29/1986    Quit date: 05/29/1996    Years since quitting: 27.6    Passive exposure: Past   Smokeless tobacco: Never  Vaping Use   Vaping status: Never Used  Substance and Sexual Activity   Alcohol use: No    Alcohol/week: 0.0 standard drinks of alcohol   Drug use: No   Sexual activity: Never  Other Topics Concern   Not on file  Social History Narrative   Right handed   Caffeine-rarely usese    Social Drivers of Health   Financial Resource Strain: Low Risk  (03/06/2023)   Overall Financial Resource Strain (CARDIA)    Difficulty of  Paying Living Expenses: Not very hard  Food Insecurity: No Food Insecurity (03/06/2023)   Hunger Vital Sign    Worried About Running Out of Food in the Last Year: Never true    Ran Out of Food in the Last Year: Never true  Transportation Needs: No Transportation Needs (03/06/2023)   PRAPARE - Administrator, Civil Service (Medical): No    Lack of Transportation (Non-Medical): No  Physical Activity: Inactive (03/06/2023)   Exercise Vital Sign    Days of Exercise per Week: 0 days    Minutes of Exercise per Session: 0 min  Stress: No Stress Concern Present (  03/06/2023)   [employer] [employer] - Occupational Stress Questionnaire    Feeling of Stress : Not at all  Social Connections: Socially Integrated (03/06/2023)   Social Connection and Isolation Panel    Frequency of Communication with Friends and Family: Three times a week    Frequency of Social Gatherings with Friends and Family: Three times a week    Attends Religious Services: More than 4 times per year    Active Member of Clubs or Organizations: No    Attends Banker Meetings: More than 4 times per year    Marital Status: Living with partner  Intimate Partner Violence: Not At Risk (03/06/2023)   Humiliation, Afraid, Rape, and Kick questionnaire    Fear of Current or Ex-Partner: No    Emotionally Abused: No    Physically Abused: No    Sexually Abused: No       PHYSICAL EXAM  Vitals:   01/22/24 1515  BP: 115/60  Pulse: 79  SpO2: 95%  Weight: 232 lb (105.2 kg)  Height: 5' 4 (1.626 m)    Body mass index is 39.82 kg/m.   General: The patient is well-developed and well-nourished and in no acute distress  HEENT:  Head is [place]/AT.  Sclera are anicteric.     Neck: No carotid bruits are noted.  The neck is nontender.  Cardiovascular: The heart has a regular rate and rhythm with a normal S1 and S2. There were no murmurs, gallops or rubs.    Skin: Extremities are without rash or   edema.    Neurologic Exam  Mental status: The patient is alert and oriented x 3 at the time of the examination. The patient has apparent normal recent and remote memory, with an apparently normal attention span and concentration ability.   Speech is normal.  Cranial nerves: Extraocular movements are full. Pupils are equal, round, and reactive to light and accomodation.  Visual fields are full.  Facial symmetry is present. There is good facial sensation to soft touch bilaterally.Facial strength is normal.  Trapezius and sternocleidomastoid strength is normal. No dysarthria is noted.  The tongue is midline, and the patient has symmetric elevation of the soft palate. No obvious hearing deficits are noted.  Motor:  Muscle bulk is normal.   Tone is normal. Strength is  4+/5 in APB muscles (worse on right).   5 / 5 in other muscles.   Sensory: She has Tine's signs at wrists but no Phalen's signs.   No Tinel's at elbows.  Sensory testing is intact to pinprick, soft touch and vibration sensation in all 4 extremities.  Coordination: Cerebellar testing reveals good finger-nose-finger and heel-to-shin bilaterally.  Gait and station: Station is normal.   Gait is normal. Tandem gait is normal. Romberg is negative.   Reflexes: Deep tendon reflexes are symmetric and normal bilaterally.      DIAGNOSTIC DATA (LABS, IMAGING, TESTING) - I reviewed patient records, labs, notes, testing and imaging myself where available.  Lab Results  Component Value Date   WBC 9.1 06/05/2022   HGB 12.1 06/05/2022   HCT 38.2 06/05/2022   MCV 88 06/05/2022   PLT 366 06/05/2022      Component Value Date/Time   NA 145 (H) 06/07/2023 1044   K 4.5 06/07/2023 1044   CL 104 06/07/2023 1044   CO2 22 06/07/2023 1044   GLUCOSE 98 06/07/2023 1044   GLUCOSE 86 08/17/2021 1828   BUN 12 06/07/2023 1044   CREATININE 0.79  06/07/2023 1044   CALCIUM  9.4 06/07/2023 1044   PROT 7.0 06/07/2023 1044   ALBUMIN 4.2 06/07/2023 1044    AST 28 06/07/2023 1044   ALT 46 (H) 06/07/2023 1044   ALKPHOS 135 (H) 06/07/2023 1044   BILITOT 0.3 06/07/2023 1044   GFRNONAA >60 08/17/2021 1828   GFRAA 101 10/15/2019 0852   Lab Results  Component Value Date   CHOL 271 (H) 06/05/2022   HDL 70 06/05/2022   LDLCALC 175 (H) 06/05/2022   TRIG 147 06/05/2022   CHOLHDL 3.9 06/05/2022   Lab Results  Component Value Date   HGBA1C 6.1 (A) 11/27/2023   No results found for: VITAMINB12 Lab Results  Component Value Date   TSH 1.950 06/07/2023       ASSESSMENT AND PLAN  Bilateral carpal tunnel syndrome - Plan: NCV with EMG(electromyography)  Pain in both hands - Plan: NCV with EMG(electromyography)  Hand numbness - Plan: NCV with EMG(electromyography)   In summary, Ms. Diedrich is a 58 year old woman with a couple month history of numbness and pain in her hands, often occurring at night or as the day progresses at work.  Symptoms are most consistent with bilateral carpal tunnel syndrome.  I recommended that she Wear wrist splints at night.  She can buy these [email or web address] or many drugstores. 6 day steroid pack NCV/EMG to evaluate the severity of the tunnel syndrome etiology.  If she has moderate or severe CTS, I would recommend that she consider surgery. Return as needed for new or worsening neurologic symptoms.  Thank you for asking me to see Ms. Williams.  Please let me know if I can be of further assistance with her or other patients in the future.   Haskel Dewalt A. Vear, MD, [facility] 01/22/2024, 3:33 PM Certified in Neurology, Clinical Neurophysiology, Sleep Medicine and Neuroimaging  [facility] [address], [address] [place], [place] [address] [phone]

## 2024-01-22 ENCOUNTER — Encounter: Payer: Self-pay | Admitting: Neurology

## 2024-01-22 ENCOUNTER — Ambulatory Visit: Admitting: Neurology

## 2024-01-22 VITALS — BP 115/60 | HR 79 | Ht 64.0 in | Wt 232.0 lb

## 2024-01-22 DIAGNOSIS — M79641 Pain in right hand: Secondary | ICD-10-CM

## 2024-01-22 DIAGNOSIS — G5603 Carpal tunnel syndrome, bilateral upper limbs: Secondary | ICD-10-CM | POA: Diagnosis not present

## 2024-01-22 DIAGNOSIS — R200 Anesthesia of skin: Secondary | ICD-10-CM

## 2024-01-22 DIAGNOSIS — M79642 Pain in left hand: Secondary | ICD-10-CM | POA: Diagnosis not present

## 2024-01-22 MED ORDER — METHYLPREDNISOLONE 4 MG PO TABS: ORAL_TABLET | ORAL | 0 refills | Status: DC

## 2024-01-23 ENCOUNTER — Other Ambulatory Visit: Payer: Self-pay | Admitting: Internal Medicine

## 2024-01-23 DIAGNOSIS — E785 Hyperlipidemia, unspecified: Secondary | ICD-10-CM

## 2024-02-02 ENCOUNTER — Other Ambulatory Visit: Payer: Self-pay | Admitting: Family

## 2024-02-02 DIAGNOSIS — G5602 Carpal tunnel syndrome, left upper limb: Secondary | ICD-10-CM

## 2024-02-04 NOTE — Telephone Encounter (Signed)
 Complete

## 2024-02-16 ENCOUNTER — Other Ambulatory Visit: Payer: Self-pay | Admitting: Internal Medicine

## 2024-02-16 DIAGNOSIS — E785 Hyperlipidemia, unspecified: Secondary | ICD-10-CM

## 2024-02-18 MED ORDER — ATORVASTATIN CALCIUM 80 MG PO TABS: 80.0000 mg | ORAL_TABLET | Freq: Every day | ORAL | 0 refills | Status: DC

## 2024-02-28 ENCOUNTER — Telehealth: Payer: Self-pay | Admitting: Neurology

## 2024-02-28 NOTE — Telephone Encounter (Signed)
 LVM and sent mychart msg informing pt of need to reschedule 03/20/24 NCV/EMG - MD departure

## 2024-03-03 NOTE — Telephone Encounter (Signed)
 Pt called back  to rescheduled  , Inform that coordinator  will call back

## 2024-03-04 NOTE — Telephone Encounter (Signed)
 LVM asking pt to call back to reschedule Nerve Conduction Study  If patient calls back you can reschedule for first available with Dr. Onita

## 2024-03-20 ENCOUNTER — Encounter: Admitting: Neurology

## 2024-03-31 ENCOUNTER — Encounter: Payer: Self-pay | Admitting: Radiology

## 2024-04-02 DIAGNOSIS — Z1231 Encounter for screening mammogram for malignant neoplasm of breast: Secondary | ICD-10-CM | POA: Diagnosis not present

## 2024-04-02 DIAGNOSIS — Z01419 Encounter for gynecological examination (general) (routine) without abnormal findings: Secondary | ICD-10-CM | POA: Diagnosis not present

## 2024-05-05 ENCOUNTER — Other Ambulatory Visit: Payer: Self-pay | Admitting: Neurology

## 2024-05-14 ENCOUNTER — Ambulatory Visit: Admitting: Neurology

## 2024-05-14 ENCOUNTER — Encounter: Payer: Self-pay | Admitting: Neurology

## 2024-05-14 VITALS — BP 117/82

## 2024-05-14 DIAGNOSIS — R200 Anesthesia of skin: Secondary | ICD-10-CM | POA: Diagnosis not present

## 2024-05-14 DIAGNOSIS — G5603 Carpal tunnel syndrome, bilateral upper limbs: Secondary | ICD-10-CM | POA: Diagnosis not present

## 2024-05-14 DIAGNOSIS — M79642 Pain in left hand: Secondary | ICD-10-CM

## 2024-05-14 DIAGNOSIS — M79641 Pain in right hand: Secondary | ICD-10-CM | POA: Diagnosis not present

## 2024-05-14 DIAGNOSIS — M25531 Pain in right wrist: Secondary | ICD-10-CM | POA: Insufficient documentation

## 2024-05-14 NOTE — Progress Notes (Unsigned)
 Chief Complaint  Patient presents with   Follow-up    Pt in EMG 3.alone. Here for NCS      ASSESSMENT AND PLAN  Allison Wright is a 58 y.o. female     DIAGNOSTIC DATA (LABS, IMAGING, TESTING) - I reviewed patient records, labs, notes, testing and imaging myself where available.   MEDICAL HISTORY:  Allison Wright, seen in request by   Allison Greig PARAS, NP [address] [place],  [place] [address], Allison Greig PARAS, NP   History is obtained from the patient and review of electronic medical records. I personally reviewed pertinent available imaging films in PACS.   PMHx of   Past Medical History:  Diagnosis Date   Allergy    Arthritis    Heart murmur    Post-menopause    Vertigo    Past Surgical History:  Procedure Laterality Date   ABDOMINAL HYSTERECTOMY  2001   Retained cervix     PHYSICAL EXAM:   Vitals:   05/14/24 0807  BP: 117/82   Not recorded     There is no height or weight on file to calculate BMI.  PHYSICAL EXAMNIATION:  Gen: NAD, conversant, well nourised, well groomed                     Cardiovascular: Regular rate rhythm, no peripheral edema, warm, nontender. Eyes: Conjunctivae clear without exudates or hemorrhage Neck: Supple, no carotid bruits. Pulmonary: Clear to auscultation bilaterally   NEUROLOGICAL EXAM:  MENTAL STATUS: Speech/cognition: Awake, alert, oriented to history taking and casual conversation CRANIAL NERVES: CN II: Visual fields are full to confrontation. Pupils are round equal and briskly reactive to light. CN III, IV, VI: extraocular movement are normal. No ptosis. CN V: Facial sensation is intact to light touch CN VII: Face is symmetric with normal eye closure  CN VIII: Hearing is normal to causal conversation. CN IX, X: Phonation is normal. CN XI: Head turning and shoulder shrug are intact  MOTOR: There is no pronator drift of out-stretched arms. Muscle bulk and tone are normal. Muscle strength is  normal.  REFLEXES: Reflexes are 2+ and symmetric at the biceps, triceps, knees, and ankles. Plantar responses are flexor.  SENSORY: Intact to light touch, pinprick and vibratory sensation are intact in fingers and toes.  COORDINATION: There is no trunk or limb dysmetria noted.  GAIT/STANCE: Posture is normal. Gait is steady with normal steps, base, arm swing, and turning. Heel and toe walking are normal. Tandem gait is normal.  Romberg is absent.  REVIEW OF SYSTEMS:  Full 14 system review of systems performed and notable only for as above All other review of systems were negative.   ALLERGIES: Allergies[1]  HOME MEDICATIONS: Current Outpatient Medications  Medication Sig Dispense Refill   atorvastatin  (LIPITOR) 80 MG tablet Take 1 tablet (80 mg total) by mouth daily. 30 tablet 0   Bempedoic Acid-Ezetimibe (NEXLIZET ) 180-10 MG TABS Take 1 tablet by mouth daily. (Patient not taking: Reported on 01/22/2024) 90 tablet 3   Cholecalciferol (VITAMIN D -3) 25 MCG (1000 UT) CAPS 2 capsules Orally Once a day     diclofenac Sodium (VOLTAREN) 1 % GEL APPLY 2-4 GRAM TO AFFECTED AREA FOUR TIMES A DAY AS NEEDED FOR PAIN     fluticasone  (FLONASE ) 50 MCG/ACT nasal spray Place 2 sprays into the nose.     gabapentin  (NEURONTIN ) 300 MG capsule TAKE 1 CAPSULE BY MOUTH THREE TIMES A DAY 90 capsule 2  ibuprofen  (ADVIL ) 600 MG tablet Take 600 mg by mouth every 6 (six) hours as needed for mild pain or moderate pain.     loratadine (CLARITIN) 10 MG tablet Take 1 tablet by mouth daily.     meclizine  (ANTIVERT ) 12.5 MG tablet Take 12.5 mg by mouth. (Patient not taking: Reported on 01/22/2024)     meloxicam  (MOBIC ) 7.5 MG tablet TAKE 1 TABLET BY MOUTH EVERY DAY 20 tablet 0   methylPREDNISolone  (MEDROL  DOSEPAK) 4 MG TBPK tablet TAKE 6 TABLETS ON DAY 1 AS DIRECTED ON PACKAGE AND DECREASE BY 1 TAB EACH DAY FOR A TOTAL OF 6 DAYS 21 each 0   tolterodine (DETROL LA) 4 MG 24 hr capsule Take 4 mg by mouth daily.      tolterodine (DETROL) 1 MG tablet 1 tablet Orally once a day     No current facility-administered medications for this visit.    PAST MEDICAL HISTORY: Past Medical History:  Diagnosis Date   Allergy    Arthritis    Heart murmur    Post-menopause    Vertigo     PAST SURGICAL HISTORY: Past Surgical History:  Procedure Laterality Date   ABDOMINAL HYSTERECTOMY  2001   Retained cervix    FAMILY HISTORY: Family History  Problem Relation Age of Onset   Cirrhosis Mother    Alcohol abuse Mother    Diabetes Father 21   Heart attack Father    Heart disease Paternal Grandmother     SOCIAL HISTORY: Social History   Socioeconomic History   Marital status: Single    Spouse name: Not on file   Number of children: Not on file   Years of education: Not on file   Highest education level: Not on file  Occupational History   Not on file  Tobacco Use   Smoking status: Former    Current packs/day: 0.00    Average packs/day: 0.5 packs/day for 10.0 years (5.0 ttl pk-yrs)    Types: Cigarettes    Start date: 05/29/1986    Quit date: 05/29/1996    Years since quitting: 27.9    Passive exposure: Past   Smokeless tobacco: Never  Vaping Use   Vaping status: Never Used  Substance and Sexual Activity   Alcohol use: No    Alcohol/week: 0.0 standard drinks of alcohol   Drug use: No   Sexual activity: Never  Other Topics Concern   Not on file  Social History Narrative   Right handed   Caffeine-rarely usese    Social Drivers of Health   Tobacco Use: Medium Risk (01/22/2024)   Patient History    Smoking Tobacco Use: Former    Smokeless Tobacco Use: Never    Passive Exposure: Past  Physicist, Medical Strain: Low Risk (03/06/2023)   Overall Financial Resource Strain (CARDIA)    Difficulty of Paying Living Expenses: Not very hard  Food Insecurity: No Food Insecurity (03/06/2023)   Hunger Vital Sign    Worried About Running Out of Food in the Last Year: Never true    Ran Out of Food in  the Last Year: Never true  Transportation Needs: No Transportation Needs (03/06/2023)   PRAPARE - Administrator, Civil Service (Medical): No    Lack of Transportation (Non-Medical): No  Physical Activity: Inactive (03/06/2023)   Exercise Vital Sign    Days of Exercise per Week: 0 days    Minutes of Exercise per Session: 0 min  Stress: No Stress Concern Present (03/06/2023)  [employer] of Occupational Health - Occupational Stress Questionnaire    Feeling of Stress : Not at all  Social Connections: Socially Integrated (03/06/2023)   Social Connection and Isolation Panel    Frequency of Communication with Friends and Family: Three times a week    Frequency of Social Gatherings with Friends and Family: Three times a week    Attends Religious Services: More than 4 times per year    Active Member of Clubs or Organizations: No    Attends Banker Meetings: More than 4 times per year    Marital Status: Living with partner  Intimate Partner Violence: Not At Risk (03/06/2023)   Humiliation, Afraid, Rape, and Kick questionnaire    Fear of Current or Ex-Partner: No    Emotionally Abused: No    Physically Abused: No    Sexually Abused: No  Depression (PHQ2-9): Low Risk (11/27/2023)   Depression (PHQ2-9)    PHQ-2 Score: 0  Alcohol Screen: Low Risk (03/06/2023)   Alcohol Screen    Last Alcohol Screening Score (AUDIT): 0  Housing: Low Risk (03/06/2023)   Housing    Last Housing Risk Score: 0  Utilities: Not At Risk (03/06/2023)   AHC Utilities    Threatened with loss of utilities: No  Health Literacy: Adequate Health Literacy (03/06/2023)   B1300 Health Literacy    Frequency of need for help with medical instructions: Never      Modena Callander, M.D. Ph.D.  [facility] [address], [address] [place], [place] [address] Ph: [phone] Fax: [phone]  CC:  Allison Greig PARAS, NP [address] [place],  [place] [address]  Allison Greig PARAS, NP       [1]  Allergies Allergen Reactions   Other     Seasonal

## 2024-05-14 NOTE — Patient Instructions (Signed)
Allison Maes, MD  Address: [address] [place], [place] [address] Phone: [phone]

## 2024-05-14 NOTE — Procedures (Unsigned)
 Full Name: Allison Wright Gender: Female MRN #: [id number] Date of Birth: 09-30-1965    Visit Date: 05/14/2024 09:00 Age: 58 Years Examining Physician: Onita Duos Referring Physician: Vear Ade Height: 5 feet 4 inch    Conclusion:     ------------------------------- Physician Name, M.D.  [facility] [address] [place], [place] [address] Tel: [phone] Fax: [phone]  Verbal informed consent was obtained from the patient, patient was informed of potential risk of procedure, including bruising, bleeding, hematoma formation, infection, muscle weakness, muscle pain, numbness, among others.        MNC    Nerve / Sites Muscle Latency Ref. Amplitude Ref. Rel Amp Segments Distance Velocity Ref. Area    ms ms mV mV %  cm m/s m/s mVms  R Median - APB     Wrist APB NR <=4.4 NR >=4.0 NR Wrist - APB 7   NR     Upper arm APB      Upper arm - Wrist   >=49   L Median - APB     Wrist APB NR <=4.4 NR >=4.0 NR Wrist - APB 7   NR     Upper arm APB 19.8  1.2   Upper arm - Wrist   >=49   R Ulnar - ADM     Wrist ADM 2.5 <=3.3 8.3 >=6.0 100 Wrist - ADM 7   21.2     B.Elbow ADM 4.9  7.7  92.6 B.Elbow - Wrist 14 58 >=49 20.7     A.Elbow ADM 7.5  8.0  104 A.Elbow - B.Elbow 13 50 >=49 21.5  L Ulnar - ADM     Wrist ADM 2.4 <=3.3 10.5 >=6.0 100 Wrist - ADM 7   25.3     B.Elbow ADM 4.4  9.4  89.4 B.Elbow - Wrist 12 61 >=49 22.7     A.Elbow ADM 6.8  9.3  98.8 A.Elbow - B.Elbow 13 54 >=49 24.5  L Tibial - AH     Ankle AH 4.9 <=5.8 4.3 >=4.0 100 Ankle - AH 9   13.4     Pop fossa AH 12.1  3.9  89.3 Pop fossa - Ankle 42 58 >=41 9.5               SNC    Nerve / Sites Rec. Site Peak Lat Ref.  Amp Ref. Segments Distance    ms ms V V  cm  L Sural - Ankle (Calf)     Calf Ankle 3.8 <=4.4 7 >=6 Calf - Ankle 14  L Median - Orthodromic (Dig II, Mid palm)     Dig II Wrist NR <=3.4 NR >=10 Dig II - Wrist 13  R Median - Orthodromic (Dig II, Mid palm)      Dig II Wrist NR <=3.4 NR >=10 Dig II - Wrist 13  L Ulnar - Orthodromic, (Dig V, Mid palm)     Dig V Wrist 2.8 <=3.1 10 >=5 Dig V - Wrist 11  R Ulnar - Orthodromic, (Dig V, Mid palm)     Dig V Wrist 2.9 <=3.1 9 >=5 Dig V - Wrist 78               F  Wave    Nerve F Lat Ref.   ms ms  R Ulnar - ADM 27.1 <=32.0  L Ulnar - ADM 25.2 <=32.0  L Tibial - AH 51.9 <=56.0  EMG Summary Table    Spontaneous MUAP Recruitment  Muscle IA Fib PSW Fasc Other Amp Dur. Poly Pattern  R. First dorsal interosseous Normal None None None _______ Normal Normal Normal Normal  R. Abductor pollicis brevis Increased None None None _______ Normal Normal Normal Reduced  R. Pronator teres Normal None None None _______ Normal Normal Normal Normal  R. Deltoid Normal None None None _______ Normal Normal Normal Normal  R. Biceps brachii Normal None None None _______ Normal Normal Normal Normal  R. Extensor digitorum communis Normal None None None _______ Normal Normal Normal Normal  L. Pronator teres Normal None None None _______ Normal Normal Normal Normal  L. Deltoid Normal None None None _______ Normal Normal Normal Normal  L. Biceps brachii Normal None None None _______ Normal Normal Normal Normal  L. Extensor digitorum communis Normal None None None _______ Normal Normal Normal Normal  L. Abductor pollicis brevis Increased None None None _______ Normal Normal Normal Reduced

## 2024-05-15 ENCOUNTER — Telehealth: Payer: Self-pay | Admitting: Neurology

## 2024-05-15 NOTE — Telephone Encounter (Signed)
 Referral To Hand Surgery  was faxed to [facility]   [facility] Phone:[phone] Fax: [phone]

## 2024-05-16 ENCOUNTER — Ambulatory Visit: Payer: Self-pay | Admitting: Neurology

## 2024-05-27 NOTE — Telephone Encounter (Signed)
 Patient called in regards to her carpal tunnel surgery that was scheduled for 06/02/24 at [facility]. Patient received a message/ call that a provider is no longer with the practice and wanted to make sure her appointment was not cancelled for Monday.  I provided her with the office's phone number and the doctor's name she was referred to so that she can call and confirm with them. I was not able to find that appointment listed in her chart due to [facility].   [phone]  Open 8am -5pm

## 2024-06-02 ENCOUNTER — Other Ambulatory Visit: Payer: Self-pay | Admitting: Orthopedic Surgery

## 2024-06-03 ENCOUNTER — Other Ambulatory Visit: Payer: Self-pay

## 2024-06-03 ENCOUNTER — Ambulatory Visit: Admitting: Orthopedic Surgery

## 2024-06-03 DIAGNOSIS — M6702 Short Achilles tendon (acquired), left ankle: Secondary | ICD-10-CM

## 2024-06-03 DIAGNOSIS — M79671 Pain in right foot: Secondary | ICD-10-CM

## 2024-06-04 ENCOUNTER — Encounter: Payer: Self-pay | Admitting: Orthopedic Surgery

## 2024-06-04 NOTE — Progress Notes (Signed)
 "  Office Visit Note   Patient: Allison Wright           Date of Birth: [birth year]           MRN: [id number] Visit Date: 06/03/2024              Requested by: Jaycee Greig PARAS, NP [address] [place],  [place] [address] PCP: Jaycee Greig PARAS, NP  Chief Complaint  Patient presents with   Right Foot - Pain   Left Foot - Pain      HPI: Discussed the use of AI scribe software for clinical note transcription with the patient, who gave verbal consent to proceed.  History of Present Illness Allison Wright is a 59 year old female with left Achilles contracture and right midfoot osteoarthritis who presents for evaluation of left Achilles and right foot pain.  She has pain localized to the posterior left heel at the Achilles tendon insertion, accompanied by tightness and restricted dorsiflexion. The pain is aggravated by movement. She has not undergone prior treatments or interventions for the Achilles symptoms.  She also experiences chronic pain across the dorsal and midfoot region of the right foot, with maximal tenderness in the midfoot. The pain intensifies with pressure and movement, and she has not used topical or oral anti-inflammatory medications for these symptoms.  Radiographs show mild osteophytic changes throughout the right midfoot and calcification at the left Achilles insertion with plantar Achilles bone spurs. Radiographs show no periarticular cystic changes.     Assessment & Plan: Visit Diagnoses:  1. Pain in right foot     Plan: Assessment and Plan Assessment & Plan Right midfoot osteoarthritis Chronic mild osteoarthritis with osteophytic bone spurs causing dorsal and midfoot pain. Tight left Achilles tendon exacerbates symptoms. - Recommended topical diclofenac gel to right midfoot. - Advised use of stiffer shoe to decrease pressure and limit motion. - Instructed on Achilles stretching exercises. - Advised follow-up if symptoms persist.  Left Achilles  contracture with tendinitis Chronic contracture with tendinitis and calcific insertional changes causing tenderness and limited dorsiflexion. Contracture increases midfoot pressure and contributes to pain. - Recommended topical diclofenac gel to Achilles insertion. - Instructed on daily Achilles stretching exercises, three times daily for one minute per session. - Advised follow-up if symptoms persist.      Follow-Up Instructions: No follow-ups on file.   Ortho Exam  Patient is alert, oriented, no adenopathy, well-dressed, normal affect, normal respiratory effort. Physical Exam CARDIOVASCULAR: Dorsalis pedis and posterior tibial pulses palpable bilaterally. MUSCULOSKELETAL: Dorsiflexion to neutral with knee extended, Achilles contracture. Tenderness at Achilles insertion on left. Tenderness across midfoot on right.      Imaging: No results found. No images are attached to the encounter.  Labs: Lab Results  Component Value Date   HGBA1C 6.1 (A) 11/27/2023   HGBA1C 6.3 (H) 06/07/2023   HGBA1C 6.4 (H) 02/06/2023     Lab Results  Component Value Date   ALBUMIN 4.2 06/07/2023   ALBUMIN 4.3 03/19/2023   ALBUMIN 4.3 08/24/2022    No results found for: MG Lab Results  Component Value Date   VD25OH 35.3 10/15/2019   VD25OH 29.0 (L) 09/23/2018   VD25OH 32.3 07/31/2018    No results found for: PREALBUMIN    Latest Ref Rng & Units 06/05/2022    3:07 PM 08/17/2021    6:28 PM 11/01/2020    3:53 PM  CBC EXTENDED  WBC 3.4 - 10.8 x10E3/uL 9.1  10.1  9.6  RBC 3.77 - 5.28 x10E6/uL 4.35  3.95  4.44   Hemoglobin 11.1 - 15.9 g/dL 87.8  88.4  87.3   HCT 34.0 - 46.6 % 38.2  36.2  40.1   Platelets 150 - 450 x10E3/uL 366  329  372   NEUT# 1.7 - 7.7 K/uL  5.7    Lymph# 0.7 - 4.0 K/uL  3.5       There is no height or weight on file to calculate BMI.  Orders:  Orders Placed This Encounter  Procedures   XR Foot Complete Right   No orders of the defined types were placed in  this encounter.    Procedures: No procedures performed  Clinical Data: No additional findings.  ROS:  All other systems negative, except as noted in the HPI. Review of Systems  Objective: Vital Signs: There were no vitals taken for this visit.  Specialty Comments:  No specialty comments available.  PMFS History: Patient Active Problem List   Diagnosis Date Noted   Bilateral carpal tunnel syndrome 05/14/2024   Right wrist pain 05/14/2024   Pain in both hands 05/14/2024   Constipation 10/29/2023   History of colon polyps 10/29/2023   Hyperlipidemia 06/06/2022   Overactive bladder 01/28/2020   Dizziness 01/30/2018   Stress fracture of left calcaneus 08/28/2017   Prediabetes 10/12/2016   Overweight 10/12/2016   Systolic ejection murmur 10/09/2016   Past Medical History:  Diagnosis Date   Allergy    Arthritis    Heart murmur    Post-menopause    Vertigo     Family History  Problem Relation Age of Onset   Cirrhosis Mother    Alcohol abuse Mother    Diabetes Father 17   Heart attack Father    Heart disease Paternal Grandmother     Past Surgical History:  Procedure Laterality Date   ABDOMINAL HYSTERECTOMY  2001   Retained cervix   Social History   Occupational History   Not on file  Tobacco Use   Smoking status: Former    Current packs/day: 0.00    Average packs/day: 0.5 packs/day for 10.0 years (5.0 ttl pk-yrs)    Types: Cigarettes    Start date: 05/29/1986    Quit date: 05/29/1996    Years since quitting: 28.0    Passive exposure: Past   Smokeless tobacco: Never  Vaping Use   Vaping status: Never Used  Substance and Sexual Activity   Alcohol use: No    Alcohol/week: 0.0 standard drinks of alcohol   Drug use: No   Sexual activity: Never         "

## 2024-06-06 ENCOUNTER — Encounter (HOSPITAL_BASED_OUTPATIENT_CLINIC_OR_DEPARTMENT_OTHER): Payer: Self-pay | Admitting: Orthopedic Surgery

## 2024-06-10 ENCOUNTER — Ambulatory Visit: Payer: BC Managed Care – PPO | Admitting: Family

## 2024-06-10 VITALS — BP 148/84 | HR 62 | Ht 64.0 in | Wt 217.0 lb

## 2024-06-10 DIAGNOSIS — E785 Hyperlipidemia, unspecified: Secondary | ICD-10-CM

## 2024-06-10 DIAGNOSIS — Z1329 Encounter for screening for other suspected endocrine disorder: Secondary | ICD-10-CM

## 2024-06-10 DIAGNOSIS — Z23 Encounter for immunization: Secondary | ICD-10-CM

## 2024-06-10 DIAGNOSIS — Z0000 Encounter for general adult medical examination without abnormal findings: Secondary | ICD-10-CM

## 2024-06-10 DIAGNOSIS — Z5320 Procedure and treatment not carried out because of patient's decision for unspecified reasons: Secondary | ICD-10-CM | POA: Diagnosis not present

## 2024-06-10 DIAGNOSIS — R030 Elevated blood-pressure reading, without diagnosis of hypertension: Secondary | ICD-10-CM | POA: Diagnosis not present

## 2024-06-10 DIAGNOSIS — Z13228 Encounter for screening for other metabolic disorders: Secondary | ICD-10-CM

## 2024-06-10 DIAGNOSIS — Z130 Encounter for screening for diseases of the blood and blood-forming organs and certain disorders involving the immune mechanism: Secondary | ICD-10-CM

## 2024-06-10 DIAGNOSIS — R7303 Prediabetes: Secondary | ICD-10-CM

## 2024-06-10 MED ORDER — ATORVASTATIN CALCIUM 80 MG PO TABS: 80.0000 mg | ORAL_TABLET | Freq: Every day | ORAL | 0 refills | Status: AC

## 2024-06-10 NOTE — Addendum Note (Signed)
 Addended by: Zowie Lundahl M on: 06/10/2024 11:16 AM   Modules accepted: Orders

## 2024-06-10 NOTE — Progress Notes (Signed)
 "   Patient ID: Allison Wright, female    DOB: [birth year]  MRN: [id number]  CC: Annual Exam   Subjective: Allison Wright is a 59 y.o. female who presents for annual exam.   Her concerns today include:  - Patient states she is already scheduled for mammogram and cervical cancer screening.  - Patient up to date on colon cancer screening per [facility].  - History of prediabetes.  - Doing well on Atorvastatin , no issues/concerns.  - Blood pressure elevated today in office. She does not check blood pressure outside of office. She limits salt intake. She exercises. She does not complain of red flag symptoms such as but not limited to chest pain, shortness of breath, worst headache of life, nausea/vomiting.  - Established with specialist for carpal tunnel.  Patient Active Problem List   Diagnosis Date Noted   Bilateral carpal tunnel syndrome 05/14/2024   Right wrist pain 05/14/2024   Pain in both hands 05/14/2024   Constipation 10/29/2023   History of colon polyps 10/29/2023   Hyperlipidemia 06/06/2022   Overactive bladder 01/28/2020   Dizziness 01/30/2018   Stress fracture of left calcaneus 08/28/2017   Prediabetes 10/12/2016   Overweight 10/12/2016   Systolic ejection murmur 10/09/2016     Medications Ordered Prior to Encounter[1]  Allergies[2]  Social History   Socioeconomic History   Marital status: Single    Spouse name: Not on file   Number of children: Not on file   Years of education: Not on file   Highest education level: 12th grade  Occupational History   Not on file  Tobacco Use   Smoking status: Former    Current packs/day: 0.00    Average packs/day: 0.5 packs/day for 10.0 years (5.0 ttl pk-yrs)    Types: Cigarettes    Start date: 05/29/1986    Quit date: 05/29/1996    Years since quitting: 28.0    Passive exposure: Past   Smokeless tobacco: Never  Vaping Use   Vaping status: Never Used  Substance and Sexual Activity   Alcohol use: No    Alcohol/week: 0.0  standard drinks of alcohol   Drug use: No   Sexual activity: Never  Other Topics Concern   Not on file  Social History Narrative   Right handed   Caffeine-rarely usese    Social Drivers of Health   Tobacco Use: Medium Risk (06/06/2024)   Patient History    Smoking Tobacco Use: Former    Smokeless Tobacco Use: Never    Passive Exposure: Past  Physicist, Medical Strain: Low Risk (06/09/2024)   Overall Financial Resource Strain (CARDIA)    Difficulty of Paying Living Expenses: Not hard at all  Food Insecurity: No Food Insecurity (06/09/2024)   Epic    Worried About Programme Researcher, Broadcasting/film/video in the Last Year: Never true    [employer] of Food in the Last Year: Never true  Transportation Needs: No Transportation Needs (06/09/2024)   Epic    Lack of Transportation (Medical): No    Lack of Transportation (Non-Medical): No  Physical Activity: Sufficiently Active (06/09/2024)   Exercise Vital Sign    Days of Exercise per Week: 5 days    Minutes of Exercise per Session: 60 min  Stress: No Stress Concern Present (06/09/2024)   [employer] of Occupational Health - Occupational Stress Questionnaire    Feeling of Stress: Not at all  Social Connections: Unknown (06/09/2024)   Social Connection and Isolation Panel    Frequency of  Communication with Friends and Family: Once a week    Frequency of Social Gatherings with Friends and Family: Once a week    Attends Religious Services: 1 to 4 times per year    Active Member of [employer] or Organizations: Yes    Attends Banker Meetings: More than 4 times per year    Marital Status: Not on file  Intimate Partner Violence: Not At Risk (03/06/2023)   Humiliation, Afraid, Rape, and Kick questionnaire    Fear of Current or Ex-Partner: No    Emotionally Abused: No    Physically Abused: No    Sexually Abused: No  Depression (PHQ2-9): Low Risk (06/10/2024)   Depression (PHQ2-9)    PHQ-2 Score: 0  Alcohol Screen: Low Risk (06/09/2024)   Alcohol Screen     Last Alcohol Screening Score (AUDIT): 1  Housing: Low Risk (06/09/2024)   Epic    Unable to Pay for Housing in the Last Year: No    Number of Times Moved in the Last Year: 0    Homeless in the Last Year: No  Utilities: Not At Risk (03/06/2023)   [employer] Utilities    Threatened with loss of utilities: No  Health Literacy: Adequate Health Literacy (03/06/2023)   B1300 Health Literacy    Frequency of need for help with medical instructions: Never    Family History  Problem Relation Age of Onset   Cirrhosis Mother    Alcohol abuse Mother    Diabetes Father 26   Heart attack Father    Heart disease Paternal Grandmother     Past Surgical History:  Procedure Laterality Date   ABDOMINAL HYSTERECTOMY  2001   Retained cervix    ROS: Review of Systems Negative except as stated above  PHYSICAL EXAM: BP (!) 148/84   Pulse 62   Ht 5' 4 (1.626 m)   Wt 217 lb (98.4 kg)   SpO2 100%   BMI 37.25 kg/m   Physical Exam HENT:     Head: Normocephalic and atraumatic.     Right Ear: Tympanic membrane, ear canal and external ear normal.     Left Ear: Tympanic membrane, ear canal and external ear normal.     Nose: Nose normal.     Mouth/Throat:     Mouth: Mucous membranes are moist.     Pharynx: Oropharynx is clear.  Eyes:     Extraocular Movements: Extraocular movements intact.     Conjunctiva/sclera: Conjunctivae normal.     Pupils: Pupils are equal, round, and reactive to light.  Neck:     Thyroid : No thyroid  mass, thyromegaly or thyroid  tenderness.  Cardiovascular:     Rate and Rhythm: Normal rate and regular rhythm.     Pulses: Normal pulses.     Heart sounds: Normal heart sounds.  Pulmonary:     Effort: Pulmonary effort is normal.     Breath sounds: Normal breath sounds.  Chest:     Comments: Patient declined.  Abdominal:     General: Bowel sounds are normal.     Palpations: Abdomen is soft.  Genitourinary:    Comments: Patient declined.  Musculoskeletal:         General: Normal range of motion.     Right shoulder: Normal.     Left shoulder: Normal.     Right upper arm: Normal.     Left upper arm: Normal.     Right elbow: Normal.     Left elbow: Normal.     Right  forearm: Normal.     Left forearm: Normal.     Right wrist: Normal.     Left wrist: Normal.     Right hand: Normal.     Left hand: Normal.     Cervical back: Normal, normal range of motion and neck supple.     Thoracic back: Normal.     Lumbar back: Normal.     Right hip: Normal.     Left hip: Normal.     Right upper leg: Normal.     Left upper leg: Normal.     Right knee: Normal.     Left knee: Normal.     Right lower leg: Normal.     Left lower leg: Normal.     Right ankle: Normal.     Left ankle: Normal.     Right foot: Normal.     Left foot: Normal.  Skin:    General: Skin is warm and dry.     Capillary Refill: Capillary refill takes less than 2 seconds.  Neurological:     General: No focal deficit present.     Mental Status: She is alert and oriented to person, place, and time.  Psychiatric:        Mood and Affect: Mood normal.        Behavior: Behavior normal.    ASSESSMENT AND PLAN: 1. Annual physical exam (Primary) - Counseled on 150 minutes of exercise per week as tolerated, healthy eating (including decreased daily intake of saturated fats, cholesterol, added sugars, sodium), STI prevention, and routine healthcare maintenance.  2. Screening for metabolic disorder - Routine screening.  - CMP14+EGFR  3. Screening for deficiency anemia - Routine screening.  - CBC  4. Prediabetes - Routine screening.  - Hemoglobin A1c  5. Hyperlipidemia, unspecified hyperlipidemia type - Continue Atorvastatin  as prescribed. Counseled on medication adherence/adverse effects.  - Routine screening.  - Follow-up with primary provider as scheduled.  - Lipid panel - atorvastatin  (LIPITOR) 80 MG tablet; Take 1 tablet (80 mg total) by mouth daily.  Dispense: 90 tablet;  Refill: 0  6. Thyroid  disorder screen - Routine screening.  - TSH  7. Breast cancer screening declined - Patient declined.   8. Cervical cancer screening declined - Patient declined.   9. Elevated blood pressure reading - Blood pressure not at goal during today's visit. Patient asymptomatic without chest pressure, chest pain, palpitations, shortness of breath, worst headache of life, and any additional red flag symptoms. - Follow-up with primary provider in 4 weeks or sooner if needed.    Patient was given the opportunity to ask questions.  Patient verbalized understanding of the plan and was able to repeat key elements of the plan. Patient was given clear instructions to go to Emergency Department or return to medical center if symptoms don't improve, worsen, or new problems develop.The patient verbalized understanding.   Orders Placed This Encounter  Procedures   CBC   Lipid panel   CMP14+EGFR   Hemoglobin A1c   TSH     Requested Prescriptions   Signed Prescriptions Disp Refills   atorvastatin  (LIPITOR) 80 MG tablet 90 tablet 0    Sig: Take 1 tablet (80 mg total) by mouth daily.    Return in about 1 year (around 06/10/2025) for Physical per patient preference and 4 weeks blood pressure check.  Linnie Mcglocklin JINNY Chute, NP      [1]  Current Outpatient Medications on File Prior to Visit  Medication Sig Dispense Refill   Cholecalciferol (  VITAMIN D -3) 25 MCG (1000 UT) CAPS 2 capsules Orally Once a day     gabapentin  (NEURONTIN ) 300 MG capsule TAKE 1 CAPSULE BY MOUTH THREE TIMES A DAY 90 capsule 2   loratadine (CLARITIN) 10 MG tablet Take 1 tablet by mouth daily.     meloxicam  (MOBIC ) 7.5 MG tablet TAKE 1 TABLET BY MOUTH EVERY DAY 20 tablet 0   tolterodine (DETROL) 1 MG tablet 1 tablet Orally once a day     No current facility-administered medications on file prior to visit.  [2]  Allergies Allergen Reactions   Pollen Extract Other (See Comments)   "

## 2024-06-11 ENCOUNTER — Ambulatory Visit: Payer: Self-pay | Admitting: Family

## 2024-06-11 DIAGNOSIS — E785 Hyperlipidemia, unspecified: Secondary | ICD-10-CM

## 2024-06-11 LAB — CMP14+EGFR
ALT: 20 IU/L (ref 0–32)
AST: 18 IU/L (ref 0–40)
Albumin: 4.1 g/dL (ref 3.8–4.9)
Alkaline Phosphatase: 106 IU/L (ref 49–135)
BUN/Creatinine Ratio: 12 (ref 9–23)
BUN: 8 mg/dL (ref 6–24)
Bilirubin Total: <0.2 mg/dL (ref 0.0–1.2)
CO2: 24 mmol/L (ref 20–29)
Calcium: 9 mg/dL (ref 8.7–10.2)
Chloride: 103 mmol/L (ref 96–106)
Creatinine, Ser: 0.65 mg/dL (ref 0.57–1.00)
Globulin, Total: 3.1 g/dL (ref 1.5–4.5)
Glucose: 93 mg/dL (ref 70–99)
Potassium: 4.3 mmol/L (ref 3.5–5.2)
Sodium: 142 mmol/L (ref 134–144)
Total Protein: 7.2 g/dL (ref 6.0–8.5)
eGFR: 101 mL/min/1.73

## 2024-06-11 LAB — LIPID PANEL
Chol/HDL Ratio: 4.2 ratio (ref 0.0–4.4)
Cholesterol, Total: 290 mg/dL — ABNORMAL HIGH (ref 100–199)
HDL: 69 mg/dL
LDL Chol Calc (NIH): 200 mg/dL — ABNORMAL HIGH (ref 0–99)
Triglycerides: 121 mg/dL (ref 0–149)
VLDL Cholesterol Cal: 21 mg/dL (ref 5–40)

## 2024-06-11 LAB — CBC
Hematocrit: 39 % (ref 34.0–46.6)
Hemoglobin: 11.9 g/dL (ref 11.1–15.9)
MCH: 28.3 pg (ref 26.6–33.0)
MCHC: 30.5 g/dL — ABNORMAL LOW (ref 31.5–35.7)
MCV: 93 fL (ref 79–97)
Platelets: 349 x10E3/uL (ref 150–450)
RBC: 4.21 x10E6/uL (ref 3.77–5.28)
RDW: 14.1 % (ref 11.7–15.4)
WBC: 9 x10E3/uL (ref 3.4–10.8)

## 2024-06-11 LAB — TSH: TSH: 2.64 u[IU]/mL (ref 0.450–4.500)

## 2024-06-11 LAB — HEMOGLOBIN A1C
Est. average glucose Bld gHb Est-mCnc: 126 mg/dL
Hgb A1c MFr Bld: 6 % — ABNORMAL HIGH (ref 4.8–5.6)

## 2024-06-13 ENCOUNTER — Ambulatory Visit (HOSPITAL_BASED_OUTPATIENT_CLINIC_OR_DEPARTMENT_OTHER): Admission: RE | Admit: 2024-06-13 | Admitting: Orthopedic Surgery

## 2024-06-13 ENCOUNTER — Encounter (HOSPITAL_BASED_OUTPATIENT_CLINIC_OR_DEPARTMENT_OTHER): Admission: RE | Payer: Self-pay | Source: Home / Self Care

## 2024-06-13 DIAGNOSIS — Z01818 Encounter for other preprocedural examination: Secondary | ICD-10-CM

## 2024-06-13 SURGERY — CARPAL TUNNEL RELEASE
Anesthesia: Regional | Site: Wrist | Laterality: Right

## 2024-06-30 ENCOUNTER — Encounter: Payer: Self-pay | Admitting: Neurology

## 2024-07-10 ENCOUNTER — Ambulatory Visit: Payer: Self-pay | Admitting: Family

## 2025-06-11 ENCOUNTER — Encounter: Payer: Self-pay | Admitting: Family
